# Patient Record
Sex: Female | Born: 1970
Health system: Southern US, Community
[De-identification: ages and names within clinical notes are randomized; demographics above are authoritative.]

## PROBLEM LIST (undated history)

## (undated) DIAGNOSIS — F32A Depression, unspecified: Secondary | ICD-10-CM

## (undated) DIAGNOSIS — G47419 Narcolepsy without cataplexy: Secondary | ICD-10-CM

## (undated) DIAGNOSIS — T7840XA Allergy, unspecified, initial encounter: Secondary | ICD-10-CM

## (undated) DIAGNOSIS — F419 Anxiety disorder, unspecified: Secondary | ICD-10-CM

## (undated) DIAGNOSIS — F329 Major depressive disorder, single episode, unspecified: Secondary | ICD-10-CM

## (undated) DIAGNOSIS — M1990 Unspecified osteoarthritis, unspecified site: Secondary | ICD-10-CM

## (undated) HISTORY — DX: Unspecified osteoarthritis, unspecified site: M19.90

## (undated) HISTORY — DX: Depression, unspecified: F32.A

## (undated) HISTORY — DX: Anxiety disorder, unspecified: F41.9

## (undated) HISTORY — DX: Allergy, unspecified, initial encounter: T78.40XA

## (undated) HISTORY — PX: TUBAL LIGATION: SHX77

## (undated) HISTORY — DX: Major depressive disorder, single episode, unspecified: F32.9

## (undated) HISTORY — DX: Narcolepsy without cataplexy: G47.419

---

## 1998-02-26 ENCOUNTER — Inpatient Hospital Stay (HOSPITAL_COMMUNITY): Admission: AD | Admit: 1998-02-26 | Discharge: 1998-02-26 | Payer: Self-pay | Admitting: Obstetrics & Gynecology

## 1998-04-13 ENCOUNTER — Encounter: Payer: Self-pay | Admitting: *Deleted

## 1998-04-13 ENCOUNTER — Encounter (HOSPITAL_COMMUNITY): Admission: RE | Admit: 1998-04-13 | Discharge: 1998-04-19 | Payer: Self-pay | Admitting: *Deleted

## 1998-04-14 ENCOUNTER — Encounter: Payer: Self-pay | Admitting: *Deleted

## 1998-04-16 ENCOUNTER — Encounter: Payer: Self-pay | Admitting: Obstetrics & Gynecology

## 1998-04-17 ENCOUNTER — Inpatient Hospital Stay (HOSPITAL_COMMUNITY): Admission: AD | Admit: 1998-04-17 | Discharge: 1998-04-19 | Payer: Self-pay | Admitting: Obstetrics

## 1998-07-10 ENCOUNTER — Emergency Department (HOSPITAL_COMMUNITY): Admission: EM | Admit: 1998-07-10 | Discharge: 1998-07-10 | Payer: Self-pay | Admitting: Emergency Medicine

## 2000-08-21 ENCOUNTER — Emergency Department (HOSPITAL_COMMUNITY): Admission: EM | Admit: 2000-08-21 | Discharge: 2000-08-21 | Payer: Self-pay

## 2001-03-09 ENCOUNTER — Encounter: Payer: Self-pay | Admitting: Emergency Medicine

## 2001-03-09 ENCOUNTER — Emergency Department (HOSPITAL_COMMUNITY): Admission: EM | Admit: 2001-03-09 | Discharge: 2001-03-09 | Payer: Self-pay | Admitting: Emergency Medicine

## 2001-03-11 ENCOUNTER — Emergency Department (HOSPITAL_COMMUNITY): Admission: EM | Admit: 2001-03-11 | Discharge: 2001-03-11 | Payer: Self-pay | Admitting: Emergency Medicine

## 2001-04-21 ENCOUNTER — Emergency Department (HOSPITAL_COMMUNITY): Admission: EM | Admit: 2001-04-21 | Discharge: 2001-04-21 | Payer: Self-pay | Admitting: Emergency Medicine

## 2001-04-29 ENCOUNTER — Encounter: Admission: RE | Admit: 2001-04-29 | Discharge: 2001-04-29 | Payer: Self-pay | Admitting: Internal Medicine

## 2001-06-30 ENCOUNTER — Emergency Department (HOSPITAL_COMMUNITY): Admission: EM | Admit: 2001-06-30 | Discharge: 2001-06-30 | Payer: Self-pay | Admitting: Emergency Medicine

## 2001-10-01 ENCOUNTER — Encounter: Payer: Self-pay | Admitting: Emergency Medicine

## 2001-10-01 ENCOUNTER — Emergency Department (HOSPITAL_COMMUNITY): Admission: EM | Admit: 2001-10-01 | Discharge: 2001-10-01 | Payer: Self-pay | Admitting: Emergency Medicine

## 2001-10-04 ENCOUNTER — Emergency Department (HOSPITAL_COMMUNITY): Admission: EM | Admit: 2001-10-04 | Discharge: 2001-10-04 | Payer: Self-pay | Admitting: Emergency Medicine

## 2001-10-04 ENCOUNTER — Encounter: Payer: Self-pay | Admitting: Emergency Medicine

## 2001-12-09 ENCOUNTER — Emergency Department (HOSPITAL_COMMUNITY): Admission: EM | Admit: 2001-12-09 | Discharge: 2001-12-09 | Payer: Self-pay | Admitting: Emergency Medicine

## 2001-12-25 ENCOUNTER — Encounter: Admission: RE | Admit: 2001-12-25 | Discharge: 2002-03-18 | Payer: Self-pay | Admitting: *Deleted

## 2002-03-17 ENCOUNTER — Encounter: Admission: RE | Admit: 2002-03-17 | Discharge: 2002-03-17 | Payer: Self-pay | Admitting: Internal Medicine

## 2004-09-02 ENCOUNTER — Emergency Department (HOSPITAL_COMMUNITY): Admission: EM | Admit: 2004-09-02 | Discharge: 2004-09-02 | Payer: Self-pay | Admitting: Family Medicine

## 2004-09-08 ENCOUNTER — Emergency Department (HOSPITAL_COMMUNITY): Admission: EM | Admit: 2004-09-08 | Discharge: 2004-09-08 | Payer: Self-pay | Admitting: Emergency Medicine

## 2005-01-30 ENCOUNTER — Emergency Department (HOSPITAL_COMMUNITY): Admission: EM | Admit: 2005-01-30 | Discharge: 2005-01-30 | Payer: Self-pay | Admitting: Emergency Medicine

## 2005-05-04 ENCOUNTER — Emergency Department (HOSPITAL_COMMUNITY): Admission: EM | Admit: 2005-05-04 | Discharge: 2005-05-04 | Payer: Self-pay | Admitting: Emergency Medicine

## 2005-11-30 IMAGING — CR DG ABDOMEN 1V
1 series · 1 of 1 positions shown · non-contrast
Comparison: none

CLINICAL DATA: Right-sided abdominal pain for one month, increasing pain over the last two days. Last bowel movement 2 days ago. 
 ABDOMEN - BHTCS: 
 An Ap view of the abdomen without previous films being available at this time for comparison show a moderate amount of fecal material within the right transverse colon. Also considerable fecal material in the region of the cecum.  No definite rectal fecal impaction is seen.  There appears to be some gas in the region of the sigmoid and considerable gas is present in the splenic flexure of the colon.  No small bowel dilatation is identified. The bones of the lower lumbar spine and pelvis appear to be within normal limits.

[view not recorded]
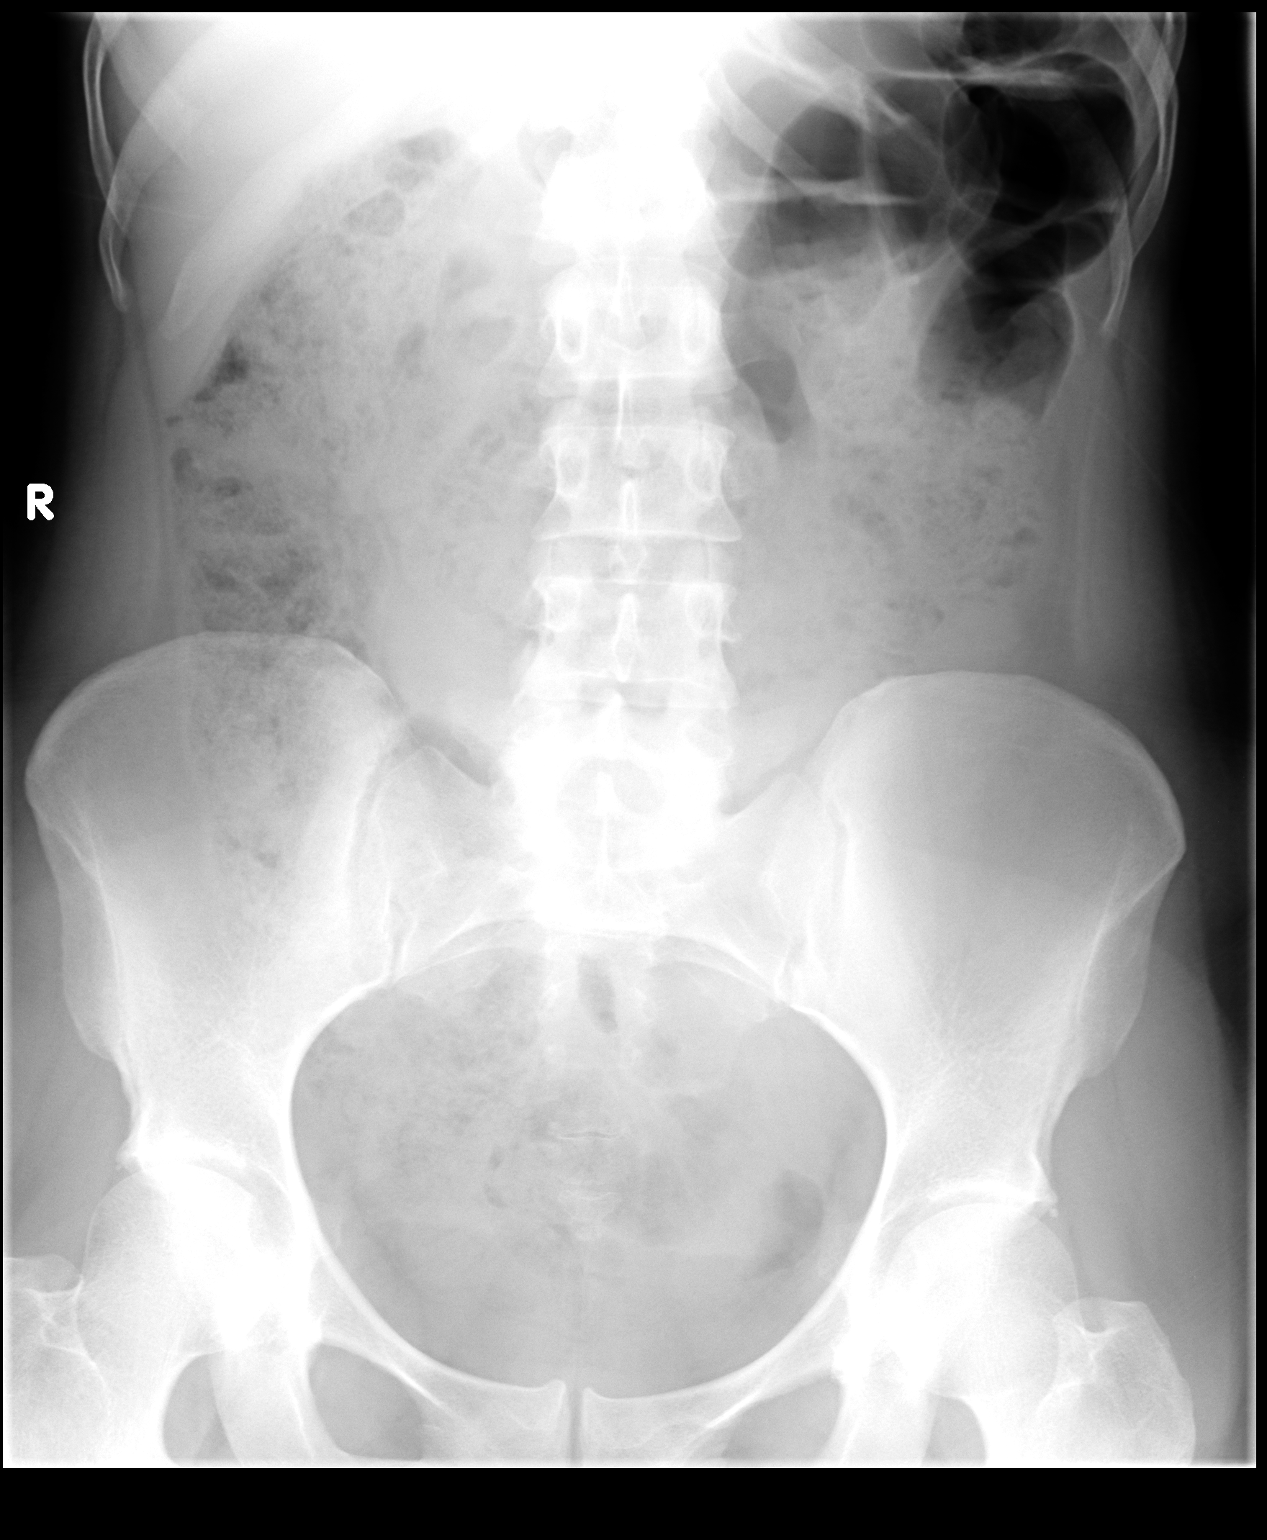

[1 of 1 positions shown; findings below may reference images not displayed]

IMPRESSION: Moderate amounts of fecal material, right and transverse colon and cecum.  Ileus region splenic flexure of the colon.  No fecal impaction in the rectum.
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
## 2013-09-30 ENCOUNTER — Emergency Department (HOSPITAL_COMMUNITY)
Admission: EM | Admit: 2013-09-30 | Discharge: 2013-09-30 | Disposition: A | Discharge status: Home / Self Care | Payer: Medicaid Other | Source: Home / Self Care | Attending: Family Medicine | Admitting: Family Medicine

## 2013-09-30 ENCOUNTER — Encounter (HOSPITAL_COMMUNITY): Payer: Self-pay | Admitting: Emergency Medicine

## 2013-09-30 DIAGNOSIS — J0110 Acute frontal sinusitis, unspecified: Secondary | ICD-10-CM | POA: Diagnosis not present

## 2013-09-30 DIAGNOSIS — H00013 Hordeolum externum right eye, unspecified eyelid: Secondary | ICD-10-CM

## 2013-09-30 DIAGNOSIS — H00019 Hordeolum externum unspecified eye, unspecified eyelid: Secondary | ICD-10-CM | POA: Diagnosis not present

## 2013-09-30 LAB — POCT RAPID STREP A: STREPTOCOCCUS, GROUP A SCREEN (DIRECT): NEGATIVE

## 2013-09-30 MED ORDER — IPRATROPIUM BROMIDE 0.06 % NA SOLN: 2.0000 | Freq: Four times a day (QID) | NASAL | Status: DC

## 2013-09-30 MED ORDER — IBUPROFEN 600 MG PO TABS: 600.0000 mg | ORAL_TABLET | Freq: Four times a day (QID) | ORAL | Status: DC | PRN

## 2013-09-30 MED ORDER — AMOXICILLIN-POT CLAVULANATE 875-125 MG PO TABS: 1.0000 | ORAL_TABLET | Freq: Two times a day (BID) | ORAL | Status: DC

## 2013-09-30 NOTE — ED Provider Notes (Signed)
CSN: [id number]     Arrival date & time 09/30/13  1100 History   First MD Initiated Contact with Patient 09/30/13 1141     Chief Complaint  Patient presents with  . URI   (Consider location/radiation/quality/duration/timing/severity/associated sxs/prior Treatment) HPI  Started w/ cough 7 days ago. gettign worse. Now /w subjective fevers, chills, earraches. Emesis x2 today. Sinus pressure and HA. Denies CP, SOB, palpitations.   R eyelid bump: started yesterday. Non-painful. Allergy eyedrops w/ mild reduction in size.   History reviewed. No pertinent past medical history. Past Surgical History  Procedure Laterality Date  . Tubal ligation     No family history on file. History  Substance Use Topics  . Smoking status: Current Every Day Smoker -- 0.50 packs/day  . Smokeless tobacco: Not on file  . Alcohol Use: Yes   OB History   Grav Para Term Preterm Abortions TAB SAB Ect Mult Living                 Review of Systems Per HPI with all other pertinent systems negative.   Allergies  Aspirin  Home Medications   Prior to Admission medications   Medication Sig Start Date End Date Taking? Authorizing Provider  Pseudoeph-Doxylamine-DM-APAP (NYQUIL PO) Take by mouth.   Yes Historical Provider, MD  pseudoephedrine-acetaminophen (TYLENOL SINUS) 30-500 MG TABS Take 1 tablet by mouth every 4 (four) hours as needed.   Yes Historical Provider, MD  amoxicillin-clavulanate (AUGMENTIN) 875-125 MG per tablet Take 1 tablet by mouth 2 (two) times daily. 09/30/13   Ozella Rocks, MD  ibuprofen (ADVIL,MOTRIN) 600 MG tablet Take 1 tablet (600 mg total) by mouth every 6 (six) hours as needed. 09/30/13   Ozella Rocks, MD  ipratropium (ATROVENT) 0.06 % nasal spray Place 2 sprays into both nostrils 4 (four) times daily. 09/30/13   Ozella Rocks, MD   BP 106/66  Pulse 84  Temp(Src) 98.3 F (36.8 C) (Oral)  Resp 16  SpO2 100%  LMP 09/09/2013 Physical Exam  Constitutional: She is oriented to  person, place, and time. She appears well-developed and well-nourished. No distress.  HENT:  Head: Normocephalic.  Frontal sinus ttp bilat. Pharyngeal injection Nasal speech Minimal cervical lymphadenopathy TM nml bilat  Eyes: EOM are normal. Pupils are equal, round, and reactive to light.  Cardiovascular: Normal rate, normal heart sounds and intact distal pulses.   No murmur heard. Pulmonary/Chest: Effort normal and breath sounds normal. No respiratory distress. She has no wheezes. She has no rales. She exhibits no tenderness.  Abdominal: Soft.  Musculoskeletal: Normal range of motion. She exhibits no edema and no tenderness.  Neurological: She is alert and oriented to person, place, and time. No cranial nerve deficit. Coordination normal.  Skin: No rash noted. She is not diaphoretic.  Psychiatric: She has a normal mood and affect. Her behavior is normal. Thought content normal.    ED Course  Procedures (including critical care time) Labs Review Labs Reviewed  POCT RAPID STREP A ([facility] CARE ONLY)    Imaging Review No results found.   MDM   1. Subacute frontal sinusitis   2. Stye, right    Sinusitis adn ongoing/worsening infection for 7 days - start augmentin - motrin 600 (ASA allergy reviewed and pt has taken motrin in past w/o difficulty) - atrovent for intermittent rhinorrhea  - nasal saline - mucinex  Stye: warm compresses  Precautions given and all questions answered  Shelly Flatten, MD Family Medicine 09/30/2013, 12:07 PM  Ozella Rocksavid J Merrell, MD 09/30/13 [phone]

## 2013-09-30 NOTE — ED Notes (Signed)
Uri: cough, green, yellow phlegm.  Right eye swollen and patient reports drainage from right eye particular in am.  Has a sore throat (intermittent), general aches , and ears hurt.  Onset one week ago. The upper respiratory symptoms are patients primary reason for being at urgent care today

## 2013-09-30 NOTE — Discharge Instructions (Signed)
You have a sinus infection that needs antibiotics Please take them until they are gone Please use the atrovent to dry up your nasal secretions Please use the motrin for fevers, swelling and pain relief Please try mucinex to help with your cough Please come back if you do not get better.  Please apply warm compresses to your R eye 2-4 times a day until the stye resolves  Sty A sty (hordeolum) is an infection of a gland in the eyelid located at the base of the eyelash. A sty may develop a white or yellow head of pus. It can be puffy (swollen). Usually, the sty will burst and pus will come out on its own. They do not leave lumps in the eyelid once they drain. A sty is often confused with another form of cyst of the eyelid called a chalazion. Chalazions occur within the eyelid and not on the edge where the bases of the eyelashes are. They often are red, sore and then form firm lumps in the eyelid. CAUSES   Germs (bacteria).  Lasting (chronic) eyelid inflammation. SYMPTOMS   Tenderness, redness and swelling along the edge of the eyelid at the base of the eyelashes.  Sometimes, there is a white or yellow head of pus. It may or may not drain. DIAGNOSIS  An ophthalmologist will be able to distinguish between a sty and a chalazion and treat the condition appropriately.  TREATMENT   Styes are typically treated with warm packs (compresses) until drainage occurs.  In rare cases, medicines that kill germs (antibiotics) may be prescribed. These antibiotics may be in the form of drops, cream or pills.  If a hard lump has formed, it is generally necessary to do a small incision and remove the hardened contents of the cyst in a minor surgical procedure done in the office.  In suspicious cases, your caregiver may send the contents of the cyst to the lab to be certain that it is not a rare, but dangerous form of cancer of the glands of the eyelid. HOME CARE INSTRUCTIONS   Wash your hands often and dry  them with a clean towel. Avoid touching your eyelid. This may spread the infection to other parts of the eye.  Apply heat to your eyelid for 10 to 20 minutes, several times a day, to ease pain and help to heal it faster.  Do not squeeze the sty. Allow it to drain on its own. Wash your eyelid carefully 3 to 4 times per day to remove any pus. SEEK IMMEDIATE MEDICAL CARE IF:   Your eye becomes painful or puffy (swollen).  Your vision changes.  Your sty does not drain by itself within 3 days.  Your sty comes back within a short period of time, even with treatment.  You have redness (inflammation) around the eye.  You have a fever. Document Released: 11/09/2004 Document Revised: 04/24/2011 Document Reviewed: 05/16/2013 [facility]ExitCare Patient Information 2015 [place]. This information is not intended to replace advice given to you by your health care provider. Make sure you discuss any questions you have with your health care provider.   Sinusitis Sinusitis is redness, soreness, and puffiness (inflammation) of the air pockets in the bones of your face (sinuses). The redness, soreness, and puffiness can cause air and mucus to get trapped in your sinuses. This can allow germs to grow and cause an infection.  HOME CARE   Drink enough fluids to keep your pee (urine) clear or pale yellow.  Use a  humidifier in your home.  Run a hot shower to create steam in the bathroom. Sit in the bathroom with the door closed. Breathe in the steam 3-4 times a day.  Put a warm, moist washcloth on your face 3-4 times a day, or as told by your doctor.  Use salt water sprays (saline sprays) to wet the thick fluid in your nose. This can help the sinuses drain.  Only take medicine as told by your doctor. GET HELP RIGHT AWAY IF:   Your pain gets worse.  You have very bad headaches.  You are sick to your stomach (nauseous).  You throw up (vomit).  You are very sleepy (drowsy) all the time.  Your face  is puffy (swollen).  Your vision changes.  You have a stiff neck.  You have trouble breathing. MAKE SURE YOU:   Understand these instructions.  Will watch your condition.  Will get help right away if you are not doing well or get worse. Document Released: 07/19/2007 Document Revised: 10/25/2011 Document Reviewed: 09/05/2011 [employer] Patient Information 2015 [place], [place]. This information is not intended to replace advice given to you by your health care provider. Make sure you discuss any questions you have with your health care provider.

## 2013-10-02 LAB — CULTURE, GROUP A STREP

## 2014-01-22 ENCOUNTER — Emergency Department (HOSPITAL_COMMUNITY): Payer: Medicaid Other

## 2014-01-22 ENCOUNTER — Encounter (HOSPITAL_COMMUNITY): Payer: Self-pay | Admitting: Emergency Medicine

## 2014-01-22 ENCOUNTER — Emergency Department (HOSPITAL_COMMUNITY)
Admission: EM | Admit: 2014-01-22 | Discharge: 2014-01-22 | Disposition: A | Discharge status: Home / Self Care | Payer: Medicaid Other | Attending: Emergency Medicine | Admitting: Emergency Medicine

## 2014-01-22 DIAGNOSIS — R109 Unspecified abdominal pain: Secondary | ICD-10-CM | POA: Diagnosis not present

## 2014-01-22 DIAGNOSIS — R110 Nausea: Secondary | ICD-10-CM | POA: Insufficient documentation

## 2014-01-22 DIAGNOSIS — N12 Tubulo-interstitial nephritis, not specified as acute or chronic: Secondary | ICD-10-CM | POA: Diagnosis not present

## 2014-01-22 DIAGNOSIS — Z9851 Tubal ligation status: Secondary | ICD-10-CM | POA: Insufficient documentation

## 2014-01-22 DIAGNOSIS — Z3202 Encounter for pregnancy test, result negative: Secondary | ICD-10-CM | POA: Insufficient documentation

## 2014-01-22 DIAGNOSIS — Z720 Tobacco use: Secondary | ICD-10-CM | POA: Diagnosis not present

## 2014-01-22 LAB — CBC WITH DIFFERENTIAL/PLATELET
BASOS PCT: 0 % (ref 0–1)
Basophils Absolute: 0 10*3/uL (ref 0.0–0.1)
Eosinophils Absolute: 0.2 10*3/uL (ref 0.0–0.7)
Eosinophils Relative: 2 % (ref 0–5)
HEMATOCRIT: 37.9 % (ref 36.0–46.0)
Hemoglobin: 12.5 g/dL (ref 12.0–15.0)
Lymphocytes Relative: 30 % (ref 12–46)
Lymphs Abs: 3.1 10*3/uL (ref 0.7–4.0)
MCH: 25.2 pg — AB (ref 26.0–34.0)
MCHC: 33 g/dL (ref 30.0–36.0)
MCV: 76.3 fL — ABNORMAL LOW (ref 78.0–100.0)
MONOS PCT: 8 % (ref 3–12)
Monocytes Absolute: 0.8 10*3/uL (ref 0.1–1.0)
NEUTROS PCT: 60 % (ref 43–77)
Neutro Abs: 6.2 10*3/uL (ref 1.7–7.7)
Platelets: 172 10*3/uL (ref 150–400)
RBC: 4.97 MIL/uL (ref 3.87–5.11)
RDW: 14.1 % (ref 11.5–15.5)
WBC: 10.3 10*3/uL (ref 4.0–10.5)

## 2014-01-22 LAB — URINALYSIS, ROUTINE W REFLEX MICROSCOPIC
BILIRUBIN URINE: NEGATIVE
Glucose, UA: NEGATIVE mg/dL
KETONES UR: NEGATIVE mg/dL
Nitrite: POSITIVE — AB
PH: 6 (ref 5.0–8.0)
Protein, ur: 30 mg/dL — AB
Specific Gravity, Urine: 1.016 (ref 1.005–1.030)
UROBILINOGEN UA: 0.2 mg/dL (ref 0.0–1.0)

## 2014-01-22 LAB — COMPREHENSIVE METABOLIC PANEL
ALBUMIN: 4.2 g/dL (ref 3.5–5.2)
ALK PHOS: 100 U/L (ref 39–117)
ALT: 26 U/L (ref 0–35)
ANION GAP: 13 (ref 5–15)
AST: 19 U/L (ref 0–37)
BUN: 8 mg/dL (ref 6–23)
CO2: 25 meq/L (ref 19–32)
CREATININE: 0.74 mg/dL (ref 0.50–1.10)
Calcium: 9.9 mg/dL (ref 8.4–10.5)
Chloride: 100 mEq/L (ref 96–112)
GFR calc Af Amer: >90 mL/min (ref >90)
GLUCOSE: 118 mg/dL — AB (ref 70–99)
Potassium: 4 mEq/L (ref 3.7–5.3)
Sodium: 138 mEq/L (ref 137–147)
TOTAL PROTEIN: 7.7 g/dL (ref 6.0–8.3)
Total Bilirubin: 0.6 mg/dL (ref 0.3–1.2)

## 2014-01-22 LAB — PREGNANCY, URINE: PREG TEST UR: NEGATIVE

## 2014-01-22 LAB — URINE MICROSCOPIC-ADD ON

## 2014-01-22 MED ORDER — DEXTROSE 5 % IV SOLN
1.0000 g | Freq: Once | INTRAVENOUS | Status: AC
  Administered 2014-01-22: 1 g via INTRAVENOUS
  Filled 2014-01-22: qty 10

## 2014-01-22 MED ORDER — CIPROFLOXACIN HCL 500 MG PO TABS: 500.0000 mg | ORAL_TABLET | Freq: Two times a day (BID) | ORAL | Status: DC

## 2014-01-22 MED ORDER — HYDROCODONE-ACETAMINOPHEN 5-325 MG PO TABS: 1.0000 | ORAL_TABLET | Freq: Four times a day (QID) | ORAL | Status: DC | PRN

## 2014-01-22 MED ORDER — HYDROMORPHONE HCL 1 MG/ML IJ SOLN
1.0000 mg | Freq: Once | INTRAMUSCULAR | Status: AC
  Administered 2014-01-22: 1 mg via INTRAVENOUS
  Filled 2014-01-22: qty 1

## 2014-01-22 MED ORDER — ONDANSETRON HCL 4 MG/2ML IJ SOLN
4.0000 mg | Freq: Once | INTRAMUSCULAR | Status: AC
  Administered 2014-01-22: 4 mg via INTRAVENOUS
  Filled 2014-01-22: qty 2

## 2014-01-22 NOTE — ED Provider Notes (Addendum)
CSN: [id number]     Arrival date & time 01/22/14  2018 History   First MD Initiated Contact with Patient 01/22/14 2057     Chief Complaint  Patient presents with  . Abdominal Pain     (Consider location/radiation/quality/duration/timing/severity/associated sxs/prior Treatment) Patient is a 43 y.o. female presenting with flank pain. The history is provided by the patient.  Flank Pain This is a new problem. The current episode started 12 to 24 hours ago. The problem occurs constantly. The problem has been gradually worsening. Associated symptoms include abdominal pain. Pertinent negatives include no chest pain and no shortness of breath. Associated symptoms comments: No fever.  Nausea without vomiting.  No cough, congestion.  No vaginal discharge or bleeding and some dysuria. Exacerbated by: urinating. Nothing relieves the symptoms. She has tried nothing for the symptoms. The treatment provided no relief.    History reviewed. No pertinent past medical history. Past Surgical History  Procedure Laterality Date  . Tubal ligation     No family history on file. History  Substance Use Topics  . Smoking status: Current Every Day Smoker -- 0.50 packs/day  . Smokeless tobacco: Not on file  . Alcohol Use: Yes   OB History    No data available     Review of Systems  Constitutional: Negative for fever.  Respiratory: Negative for shortness of breath.   Cardiovascular: Negative for chest pain.  Gastrointestinal: Positive for abdominal pain.  Genitourinary: Positive for flank pain.  All other systems reviewed and are negative.     Allergies  Aspirin  Home Medications   Prior to Admission medications   Medication Sig Start Date End Date Taking? Authorizing Provider  amoxicillin-clavulanate (AUGMENTIN) 875-125 MG per tablet Take 1 tablet by mouth 2 (two) times daily. Patient not taking: Reported on 01/22/2014 09/30/13   Ozella Rocksavid J Merrell, MD  ibuprofen (ADVIL,MOTRIN) 600 MG tablet Take  1 tablet (600 mg total) by mouth every 6 (six) hours as needed. Patient not taking: Reported on 01/22/2014 09/30/13   Ozella Rocksavid J Merrell, MD  ipratropium (ATROVENT) 0.06 % nasal spray Place 2 sprays into both nostrils 4 (four) times daily. Patient not taking: Reported on 01/22/2014 09/30/13   Ozella Rocksavid J Merrell, MD   BP 121/73 mmHg  Pulse 98  Temp(Src) 98.6 F (37 C) (Oral)  Resp 14  Ht 5' 4.5" (1.638 m)  Wt 158 lb (71.668 kg)  BMI 26.71 kg/m2  SpO2 99%  LMP 01/10/2014 Physical Exam  Constitutional: She is oriented to person, place, and time. She appears well-developed and well-nourished. No distress.  Appears uncomfortable  HENT:  Head: Normocephalic and atraumatic.  Mouth/Throat: Oropharynx is clear and moist.  Eyes: Conjunctivae and EOM are normal. Pupils are equal, round, and reactive to light.  Neck: Normal range of motion. Neck supple.  Cardiovascular: Normal rate, regular rhythm and intact distal pulses.   No murmur heard. Pulmonary/Chest: Effort normal and breath sounds normal. No respiratory distress. She has no wheezes. She has no rales.  Abdominal: Soft. Normal appearance. She exhibits no distension. There is tenderness in the suprapubic area. There is CVA tenderness. There is no rebound and no guarding.  Right flank tenderness  Musculoskeletal: Normal range of motion. She exhibits no edema or tenderness.  Neurological: She is alert and oriented to person, place, and time.  Skin: Skin is warm and dry. No rash noted. No erythema.  Psychiatric: She has a normal mood and affect. Her behavior is normal.  Nursing note and vitals reviewed.  ED Course  Procedures (including critical care time) Labs Review Labs Reviewed  CBC WITH DIFFERENTIAL - Abnormal; Notable for the following:    MCV 76.3 (*)    MCH 25.2 (*)    All other components within normal limits  COMPREHENSIVE METABOLIC PANEL - Abnormal; Notable for the following:    Glucose, Bld 118 (*)    All other components  within normal limits  URINALYSIS, ROUTINE W REFLEX MICROSCOPIC - Abnormal; Notable for the following:    APPearance CLOUDY (*)    Hgb urine dipstick LARGE (*)    Protein, ur 30 (*)    Nitrite POSITIVE (*)    Leukocytes, UA LARGE (*)    All other components within normal limits  URINE MICROSCOPIC-ADD ON - Abnormal; Notable for the following:    Bacteria, UA MANY (*)    All other components within normal limits  PREGNANCY, URINE    Imaging Review Ct Abdomen Pelvis Wo Contrast  01/22/2014   CLINICAL DATA:  Right flank pain and  urinary frequency today.  EXAM: CT ABDOMEN AND PELVIS WITHOUT CONTRAST  TECHNIQUE: Multidetector CT imaging of the abdomen and pelvis was performed following the standard protocol without IV contrast.  COMPARISON:  None.  FINDINGS: There is no nephrolithiasis bilaterally. There is slight malrotation of the right kidney. There is slight fullness of right renal pelvis. There is no evidence of hydroureter bilaterally.  The liver, spleen, pancreas, gallbladder, adrenal glands are normal there is minimal atherosclerosis of the abdominal aorta without aneurysmal dilatation. There is no abdominal lymphadenopathy. There is no small bowel obstruction or diverticulitis. The appendix is normal.  Partial decompressed bladder limits evaluation. The uterus is normal. 4.3 mm calcific densities identified within the posterior right pelvis on image 58 more favor venous phlebolith or vascular calcification. Mild dependent atelectasis of the right lung base is noted. Visualized bones demonstrate no acute abnormality.  IMPRESSION: Slight mal rotation of the right kidney with mild fullness of the right renal pelvis. No hydroureter is identified bilaterally. No nephrolithiasis bilaterally.   Electronically Signed   By: Sherian ReinWei-Chen  Lin M.D.   On: 01/22/2014 22:03     EKG Interpretation None      MDM   Final diagnoses:  Right flank pain  Pyelonephritis   Pt here with Right flank pain  radiating to groin that started earlier today and got worse.  No vaginal sx and no resp and cardiac sx.  Concern for kidney stone vs pyelonephritis.    10:17 PM Labs consistent with UTI.  CT neg for stone.  Will treat with pain and antibiotics.   10:48 PM On re-eval pt's pain is gone.  Will treat with cipro and d/c home.  Gwyneth SproutWhitney Kristyl Athens, MD 01/22/14 [id number]  Gwyneth SproutWhitney Bharath Bernstein, MD 01/22/14 2252

## 2014-01-22 NOTE — ED Notes (Addendum)
Dr. Plunkett MD at bedside. 

## 2014-01-22 NOTE — ED Notes (Signed)
Pt. reports right lateral abdominal pain radiating to right flank onset today , denies dysuria , no hematuria , denies nausea or vomitting . No fever or chills.

## 2016-03-06 ENCOUNTER — Emergency Department (HOSPITAL_COMMUNITY)
Admission: EM | Admit: 2016-03-06 | Discharge: 2016-03-07 | Disposition: A | Discharge status: Home / Self Care | Payer: Medicare Other | Attending: Emergency Medicine | Admitting: Emergency Medicine

## 2016-03-06 ENCOUNTER — Encounter (HOSPITAL_COMMUNITY): Payer: Self-pay | Admitting: Emergency Medicine

## 2016-03-06 DIAGNOSIS — K0889 Other specified disorders of teeth and supporting structures: Secondary | ICD-10-CM

## 2016-03-06 DIAGNOSIS — K047 Periapical abscess without sinus: Secondary | ICD-10-CM

## 2016-03-06 DIAGNOSIS — F17200 Nicotine dependence, unspecified, uncomplicated: Secondary | ICD-10-CM | POA: Insufficient documentation

## 2016-03-06 MED ORDER — ACETAMINOPHEN 325 MG PO TABS
650.0000 mg | ORAL_TABLET | Freq: Once | ORAL | Status: AC
  Administered 2016-03-07: 650 mg via ORAL
  Filled 2016-03-06: qty 2

## 2016-03-06 MED ORDER — PENICILLIN V POTASSIUM 500 MG PO TABS: 500.0000 mg | ORAL_TABLET | Freq: Four times a day (QID) | ORAL | 0 refills | Status: DC

## 2016-03-06 MED ORDER — TRAMADOL HCL 50 MG PO TABS
50.0000 mg | ORAL_TABLET | Freq: Once | ORAL | Status: AC
  Administered 2016-03-07: 50 mg via ORAL
  Filled 2016-03-06: qty 1

## 2016-03-06 MED ORDER — NAPROXEN 500 MG PO TABS
500.0000 mg | ORAL_TABLET | Freq: Two times a day (BID) | ORAL | 0 refills | Status: DC
Start: 2016-03-06 — End: 2017-04-12

## 2016-03-06 MED ORDER — TRAMADOL HCL 50 MG PO TABS: 50.0000 mg | ORAL_TABLET | Freq: Four times a day (QID) | ORAL | 0 refills | Status: DC | PRN

## 2016-03-06 NOTE — ED Triage Notes (Signed)
Pt st's she had pain in upper front tooth yesterday and today she has abscess in top of mouth.  St's tooth pain has subsided.

## 2016-03-06 NOTE — Discharge Instructions (Addendum)
Please read and follow all provided instructions.  Your diagnoses today include:  1. Dental abscess   2. Pain, dental    Tests performed today include: Vital signs. See below for your results today.   Medications prescribed:  Take as prescribed   Home care instructions:  Follow any educational materials contained in this packet.  Follow-up instructions: Please follow-up with your dentist for further evaluation of symptoms and treatment   Please Contact this number: [phone] to get into contact with an emergency dental service to schedule an appointment with a local dentist   Return instructions:  Please return to the Emergency Department if you do not get better, if you get worse, or new symptoms OR  - Fever (temperature greater than 101.18F)  - Bleeding that does not stop with holding pressure to the area    -Severe pain (please note that you may be more sore the day after your accident)  - Chest Pain  - Difficulty breathing  - Severe nausea or vomiting  - Inability to tolerate food and liquids  - Passing out  - Skin becoming red around your wounds  - Change in mental status (confusion or lethargy)  - New numbness or weakness    Please return if you have any other emergent concerns.  Additional Information:  Your vital signs today were: BP 131/86 (BP Location: Right Arm)    Pulse 87    Temp 98.9 F (37.2 C) (Oral)    Resp 18    Ht 5' 5.5" (1.664 m)    Wt 65.8 kg    LMP 02/28/2016 (Approximate)    SpO2 99%    BMI 23.76 kg/m  If your blood pressure (BP) was elevated above 135/85 this visit, please have this repeated by your doctor within one month. ---------------

## 2016-03-06 NOTE — ED Notes (Signed)
PA at bedside.

## 2016-03-06 NOTE — ED Provider Notes (Signed)
[facility]-EMERGENCY DEPT Provider Note   CSN: [id number] Arrival date & time: 03/06/16  2140   By signing my name below, I, Sherry Leonard, attest that this documentation has been prepared under the direction and in the presence of Audry Piliyler Braden Cimo, PA-C Electronically Signed: Soijett Leonard, ED Scribe. 03/06/16. 10:35 PM.  History   Chief Complaint Chief Complaint  Patient presents with  . Abscess    HPI Sherry Leonard is a 46 y.o. female who presents to the Emergency Department complaining of abscess to top of mouth onset earlier today. Pt states that she called a dentist to set up an appointment with no success. Pt notes that her symptoms began with upper front dental pain x yesterday that has since resolved. She has tried warm saltwater gargles without medications with no relief of her symptoms. Denies trouble swallowing, appetite change, chills, drainage, and any other symptoms.   The history is provided by the patient. No language interpreter was used.    History reviewed. No pertinent past medical history.  There are no active problems to display for this patient.   Past Surgical History:  Procedure Laterality Date  . TUBAL LIGATION      OB History    No data available       Home Medications    Prior to Admission medications   Medication Sig Start Date End Date Taking? Authorizing Provider  amoxicillin-clavulanate (AUGMENTIN) 875-125 MG per tablet Take 1 tablet by mouth 2 (two) times daily. Patient not taking: Reported on 01/22/2014 09/30/13   Ozella Rocksavid J Merrell, MD  ciprofloxacin (CIPRO) 500 MG tablet Take 1 tablet (500 mg total) by mouth 2 (two) times daily. 01/22/14   Gwyneth SproutWhitney Plunkett, MD  HYDROcodone-acetaminophen (NORCO/VICODIN) 5-325 MG per tablet Take 1-2 tablets by mouth every 6 (six) hours as needed for moderate pain or severe pain. 01/22/14   Gwyneth SproutWhitney Plunkett, MD  ibuprofen (ADVIL,MOTRIN) 600 MG tablet Take 1 tablet (600 mg total) by mouth every 6 (six) hours as  needed. Patient not taking: Reported on 01/22/2014 09/30/13   Ozella Rocksavid J Merrell, MD  ipratropium (ATROVENT) 0.06 % nasal spray Place 2 sprays into both nostrils 4 (four) times daily. Patient not taking: Reported on 01/22/2014 09/30/13   Ozella Rocksavid J Merrell, MD    Family History No family history on file.  Social History Social History  Substance Use Topics  . Smoking status: Current Every Day Smoker    Packs/day: 0.50  . Smokeless tobacco: Never Used  . Alcohol use Yes     Allergies   Aspirin   Review of Systems Review of Systems  Constitutional: Negative for appetite change, chills and fever.  HENT: Positive for dental problem (resolved). Negative for trouble swallowing.        +abscess to roof of mouth without drainage.   Physical Exam Updated Vital Signs BP 131/86 (BP Location: Right Arm)   Pulse 87   Temp 98.9 F (37.2 C) (Oral)   Resp 18   Ht 5' 5.5" (1.664 m)   Wt 145 lb (65.8 kg)   LMP 02/28/2016 (Approximate)   SpO2 99%   BMI 23.76 kg/m   Physical Exam  Constitutional: She is oriented to person, place, and time. She appears well-developed and well-nourished. No distress.  HENT:  Head: Normocephalic and atraumatic.  Mouth/Throat: Uvula is midline, oropharynx is clear and moist and mucous membranes are normal. No trismus in the jaw. Abnormal dentition. Dental caries present. No oropharyngeal exudate, posterior oropharyngeal erythema or tonsillar abscesses.  1 cm  fluctuant abscess on roof of mouth, posterior to tooth #7. No trismus. No uvular deviation. Poor dentition noted with dental caries  Eyes: EOM are normal.  Neck: Neck supple.  Cardiovascular: Normal rate.   Pulmonary/Chest: Effort normal. No respiratory distress.  Abdominal: She exhibits no distension.  Musculoskeletal: Normal range of motion.  Neurological: She is alert and oriented to person, place, and time.  Skin: Skin is warm and dry.  Psychiatric: She has a normal mood and affect. Her behavior is  normal.  Nursing note and vitals reviewed.  ED Treatments / Results  DIAGNOSTIC STUDIES: Oxygen Saturation is 99% on RA, nl by my interpretation.    COORDINATION OF CARE: 10:25 PM Discussed treatment plan with pt at bedside which includes I&D, abx Rx, referral and follow up with dentist and pt agreed to plan.   Procedures Dental Block Date/Time: 03/06/2016 10:34 PM Performed by: Audry Pili Authorized by: Audry Pili   Consent:    Consent obtained:  Verbal   Consent given by:  Patient   Risks discussed:  Infection Indications:    Indications: dental abscess   Location:    Anesthesia block type: superior soft palate. Procedure details (see MAR for exact dosages):    Syringe type:  Aspirating dental syringe   Needle gauge:  27 G   Anesthetic injected:  Bupivacaine 0.5% WITH epi   Injection procedure:  Anatomic landmarks identified, anatomic landmarks palpated and introduced needle Post-procedure details:    Outcome: minimal anesthesia achieved due to pt moving.   Patient tolerance of procedure:  Tolerated well, no immediate complications   (including critical care time)  Medications Ordered in ED Medications  traMADol (ULTRAM) tablet 50 mg (not administered)  acetaminophen (TYLENOL) tablet 650 mg (not administered)     Initial Impression / Assessment and Plan / ED Course  I have reviewed the triage vital signs and the nursing notes.  I have reviewed the relevant previous healthcare records. I obtained HPI from historian.  ED Course:  Assessment: Dental pain associated with dental cary with signs of small dental abscess on superior soft palate with patient afebrile, non toxic appearing and swallowing secretions well. Exam unconcerning for Ludwig's angina or other deep tissue infection in neck. As there is swelling, but no erythema, and no facial swelling, will treat with antibiotic and pain medicine. Urged patient to follow-up with dentist. Attempted to drain abscess in  ED, but patient refused further intervention due to too much pain on attempt for analgesia. Counseled on home tx for dental abscess and strict return precautions.   I gave patient referral to dentist and stressed the importance of dental follow up for ultimate management of dental pain. Patient voices understanding and is agreeable to plan.   Disposition/Plan:  DC home Additional Verbal discharge instructions given and discussed with patient.  Pt Instructed to f/u with dentist in the next week for evaluation and treatment of symptoms. Return precautions given Pt acknowledges and agrees with plan  Supervising Physician Marily Memos, MD  Final Clinical Impressions(s) / ED Diagnoses   Final diagnoses:  Dental abscess  Pain, dental    New Prescriptions New Prescriptions   No medications on file    I personally performed the services described in this documentation, which was scribed in my presence. The recorded information has been reviewed and is accurate.    Audry Pili, PA-C 03/07/16 0021    Marily Memos, MD 03/07/16 1250

## 2016-03-07 DIAGNOSIS — K047 Periapical abscess without sinus: Secondary | ICD-10-CM | POA: Diagnosis not present

## 2016-03-07 MED ORDER — TRAMADOL HCL 50 MG PO TABS: 50.0000 mg | ORAL_TABLET | Freq: Four times a day (QID) | ORAL | 0 refills | Status: DC | PRN

## 2016-03-07 MED ORDER — CLINDAMYCIN HCL 300 MG PO CAPS: 300.0000 mg | ORAL_CAPSULE | Freq: Three times a day (TID) | ORAL | 0 refills | Status: AC

## 2016-03-07 MED ORDER — MORPHINE SULFATE (PF) 4 MG/ML IV SOLN
4.0000 mg | Freq: Once | INTRAVENOUS | Status: AC
  Administered 2016-03-07: 4 mg via INTRAMUSCULAR
  Filled 2016-03-07: qty 1

## 2016-03-07 NOTE — ED Notes (Signed)
Pt complaining of extreme pain. PA notified.

## 2017-04-12 ENCOUNTER — Encounter: Payer: Self-pay | Admitting: Family Medicine

## 2017-04-12 ENCOUNTER — Ambulatory Visit (INDEPENDENT_AMBULATORY_CARE_PROVIDER_SITE_OTHER): Payer: Medicare Other | Admitting: Family Medicine

## 2017-04-12 ENCOUNTER — Other Ambulatory Visit: Payer: Self-pay

## 2017-04-12 VITALS — BP 107/76 | HR 78 | Temp 99.1°F | Resp 17 | Ht 66.5 in | Wt 172.6 lb

## 2017-04-12 DIAGNOSIS — R5382 Chronic fatigue, unspecified: Secondary | ICD-10-CM

## 2017-04-12 DIAGNOSIS — R3589 Other polyuria: Secondary | ICD-10-CM

## 2017-04-12 DIAGNOSIS — Z833 Family history of diabetes mellitus: Secondary | ICD-10-CM | POA: Diagnosis not present

## 2017-04-12 DIAGNOSIS — F322 Major depressive disorder, single episode, severe without psychotic features: Secondary | ICD-10-CM

## 2017-04-12 DIAGNOSIS — R358 Other polyuria: Secondary | ICD-10-CM

## 2017-04-12 DIAGNOSIS — J310 Chronic rhinitis: Secondary | ICD-10-CM | POA: Diagnosis not present

## 2017-04-12 DIAGNOSIS — R7301 Impaired fasting glucose: Secondary | ICD-10-CM | POA: Diagnosis not present

## 2017-04-12 DIAGNOSIS — Z131 Encounter for screening for diabetes mellitus: Secondary | ICD-10-CM

## 2017-04-12 DIAGNOSIS — G47419 Narcolepsy without cataplexy: Secondary | ICD-10-CM

## 2017-04-12 DIAGNOSIS — F17200 Nicotine dependence, unspecified, uncomplicated: Secondary | ICD-10-CM | POA: Diagnosis not present

## 2017-04-12 MED ORDER — CETIRIZINE HCL 10 MG PO TABS: 10.0000 mg | ORAL_TABLET | Freq: Every day | ORAL | 11 refills | Status: DC

## 2017-04-12 MED ORDER — FLUTICASONE PROPIONATE 50 MCG/ACT NA SUSP: 2.0000 | Freq: Every day | NASAL | 6 refills | Status: DC

## 2017-04-12 NOTE — Patient Instructions (Addendum)
   IF you received an x-ray today, you will receive an invoice from [facility]. Please contact [facility] at [phone] with questions or concerns regarding your invoice.   IF you received labwork today, you will receive an invoice from [facility]. Please contact [facility] at [phone] with questions or concerns regarding your invoice.   Our billing staff will not be able to assist you with questions regarding bills from these companies.  You will be contacted with the lab results as soon as they are available. The fastest way to get your results is to activate your My Chart account. Instructions are located on the last page of this paperwork. If you have not heard from us regarding the results in 2 weeks, please contact this office.    We recommend that you schedule a mammogram for breast cancer screening. Typically, you do not need a referral to do this. Please contact a local imaging center to schedule your mammogram.  [facility] - [phone]  *ask for the Radiology Department [facility] ([facility]) - [phone] or [phone]  [facility] - [phone] [facility] - [phone] [facility] - [phone]  *ask for the Radiology Department [facility] - [phone]  *ask for the Radiology Department [facility] - [phone]  *ask for the Mammography Department [facility] - [phone] Fatigue Fatigue is feeling tired all of the time, a lack of energy, or a lack of motivation. Occasional or mild fatigue is often a normal response to activity or life in general. However, long-lasting (chronic) or extreme fatigue may indicate an underlying medical condition. Follow these instructions at home: Watch your fatigue for any changes. The following actions may help to lessen any discomfort you are feeling:  Talk to your health care provider about how much  sleep you need each night. Try to get the required amount every night.  Take medicines only as directed by your health care provider.  Eat a healthy and nutritious diet. Ask your health care provider if you need help changing your diet.  Drink enough fluid to keep your urine clear or pale yellow.  Practice ways of relaxing, such as yoga, meditation, massage therapy, or acupuncture.  Exercise regularly.  Change situations that cause you stress. Try to keep your work and personal routine reasonable.  Do not abuse illegal drugs.  Limit alcohol intake to no more than 1 drink per day for nonpregnant women and 2 drinks per day for men. One drink equals 12 ounces of beer, 5 ounces of wine, or 1 ounces of hard liquor.  Take a multivitamin, if directed by your health care provider.  Contact a health care provider if:  Your fatigue does not get better.  You have a fever.  You have unintentional weight loss or gain.  You have headaches.  You have difficulty: ? Falling asleep. ? Sleeping throughout the night.  You feel angry, guilty, anxious, or sad.  You are unable to have a bowel movement (constipation).  You skin is dry.  Your legs or another part of your body is swollen. Get help right away if:  You feel confused.  Your vision is blurry.  You feel faint or pass out.  You have a severe headache.  You have severe abdominal, pelvic, or back pain.  You have chest pain, shortness of breath, or an irregular or fast heartbeat.  You are unable to urinate or you urinate   less than normal.  You develop abnormal bleeding, such as bleeding from the rectum, vagina, nose, lungs, or nipples.  You vomit blood.  You have thoughts about harming yourself or committing suicide.  You are worried that you might harm someone else. This information is not intended to replace advice given to you by your health care provider. Make sure you discuss any questions you have with your health  care provider. Document Released: 11/27/2006 Document Revised: 07/08/2015 Document Reviewed: 06/03/2013 Elsevier Interactive Patient Education  2018 [employer]vier Inc[employer]   

## 2017-04-12 NOTE — Progress Notes (Signed)
Chief Complaint  Patient presents with  . New Patient (Initial Visit)    establish care.  Pt has concerns about being diabetic, c/o dry mouth. Pt c/o dizziness upon standing and unsure why.    HPI  Narcolepsy Patient reports that she was diagnosed with Narcolepsy She was diagnosed in childhood due to falling asleep and feeling week or having her legs give out on her She states that she is a former patient of Dr. 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  She is here for referral to get back in to see Neurology.  Polyuria, polydipsia, screening for diabetes She has a family history of diabetes She has low energy, dry mouth, increased hunger and thirst, she is also urinating more often She denies nausea and vomiting She states that she did not drink any water for six months because she drank tap water and got sick so she stopped drinking water and could not afford bottled water. She was getting vomiting and diarrhea from drinking sink water.   Severe Depression She has a history of anxiety and depression Depression screen PHQ 2/9 04/12/2017  Decreased Interest 3  Down, Depressed, Hopeless 2  PHQ - 2 Score 5  Altered sleeping 3  Tired, decreased energy 3  Change in appetite 3  Feeling bad or failure about yourself  3  Trouble concentrating 3  Moving slowly or fidgety/restless 3  Suicidal thoughts 1  PHQ-9 Score 24  Difficult doing work/chores Very difficult   She states that she has had depression "since I can remember". She only had one suicide attempt at age 64 - 50  She states that she has always had depression  She uses natural herbs for her moods and drinks hibiscus tea She states that on some of the days she would wonder if "it would be a better life if she was note here".  She states that she does not trust herself to go do "something crazy like kill herself".  She does not act on her thoughts.   Tobacco use  She has been smoking for  20 years In the past 4 years she has been smoking 2.5 packs a day She states that the smoking is worsened by her mood She states that this past year she cut down to 1 pack per day Her son reports that he will help her but not until she cuts down to 1 pack per day She reports that she has a cough She states that she has runny nose and facial congestion and burning  She reports that she does not have a morning cough   Past Medical History:  Diagnosis Date  . Allergy   . Anxiety   . Arthritis   . Depression   . Narcolepsy     Current Outpatient Medications  Medication Sig Dispense Refill  . cetirizine (ZYRTEC) 10 MG tablet Take 1 tablet (10 mg total) by mouth daily. 30 tablet 11  . fluticasone (FLONASE) 50 MCG/ACT nasal spray Place 2 sprays into both nostrils daily. 16 g 6   No current facility-administered medications for this visit.     Allergies:  Allergies  Allergen Reactions  . Aspirin     itch    Past Surgical History:  Procedure Laterality Date  . TUBAL LIGATION      Social History   Socioeconomic History  . Marital status: Married    Spouse name: None  . Number of children: None  . Years  of education: None  . Highest education level: None  Social Needs  . Financial resource strain: None  . Food insecurity - worry: None  . Food insecurity - inability: None  . Transportation needs - medical: None  . Transportation needs - non-medical: None  Occupational History  . None  Tobacco Use  . Smoking status: Current Every Day Smoker    Packs/day: 0.50  . Smokeless tobacco: Never Used  Substance and Sexual Activity  . Alcohol use: Yes  . Drug use: No  . Sexual activity: Yes  Other Topics Concern  . None  Social History Narrative  . None    No family history on file.   Review of Systems  Constitutional: Negative for chills, fever and weight loss.  HENT: Positive for congestion. Negative for sinus pain.   Respiratory: Positive for cough. Negative for  shortness of breath, wheezing and stridor.   Cardiovascular: Negative for chest pain, palpitations and orthopnea.  Gastrointestinal: Negative for abdominal pain, blood in stool, constipation, diarrhea, nausea and vomiting.  Genitourinary: Negative for dysuria, frequency, hematuria and urgency.  Musculoskeletal: Negative for joint pain and myalgias.  Skin: Negative for itching and rash.  Neurological: Negative for dizziness, tingling, tremors and headaches.  Psychiatric/Behavioral: Positive for depression. The patient is not nervous/anxious and does not have insomnia.       Objective: Vitals:   04/12/17 1432  BP: 107/76  Pulse: 78  Resp: 17  Temp: 99.1 F (37.3 C)  TempSrc: Oral  SpO2: 98%  Weight: 172 lb 9.6 oz (78.3 kg)  Height: 5' 6.5" (1.689 m)    Physical Exam  Constitutional: She is oriented to person, place, and time. She appears well-developed and well-nourished.  HENT:  Head: Normocephalic and atraumatic.  Right Ear: External ear normal.  Left Ear: External ear normal.  Nose: Nose normal.  Mouth/Throat: Oropharynx is clear and moist. No oropharyngeal exudate.  Dry mouth   Eyes: Conjunctivae and EOM are normal.  Neck: Normal range of motion. No thyromegaly present.  Cardiovascular: Normal rate, regular rhythm and normal heart sounds.  No murmur heard. Pulmonary/Chest: Effort normal and breath sounds normal. No stridor. No respiratory distress.  Abdominal: Soft. Bowel sounds are normal. She exhibits no distension and no mass. There is no tenderness. There is no guarding.  Musculoskeletal: Normal range of motion. She exhibits no edema.  Neurological: She is alert and oriented to person, place, and time.  Skin: Skin is warm. Capillary refill takes less than 2 seconds. No rash noted. No erythema.  Psychiatric: She has a normal mood and affect. Her behavior is normal. Judgment and thought content normal.    Assessment and Plan Phyliss was seen today for new patient  (initial visit).  Diagnoses and all orders for this visit:  Primary narcolepsy without cataplexy- referral for continuity of care -     Ambulatory referral to Neurology  Chronic fatigue- poor nutrition and smoking contributing Will screen for other causes -     Comprehensive metabolic panel -     CBC -     TSH  Severe depression (HCC)- will check labs and then discuss treatment at the next visit Pt stable for discharge home as she has no active SI -     Comprehensive metabolic panel -     CBC -     TSH  Family history of diabetes mellitus Screening for diabetes mellitus -   Reviewed modifiable risk factors -   Advised pt to return with a log  of what she is eating -     Hemoglobin A1c  Polyuria- discussed screening for diabetes No urine dip collected -     Hemoglobin A1c -     POCT urinalysis dipstick  Impaired fasting glucose -     Hemoglobin A1c  Tobacco use disorder, severe, dependence- smoking cessation discussed.  Maybe benefit from a medication for depression and to help with smoking cessation. Will discuss wellbutrin.   Chronic rhinitis- will treat symptoms with flonase and antihistamine -     fluticasone (FLONASE) 50 MCG/ACT nasal spray; Place 2 sprays into both nostrils daily. -     cetirizine (ZYRTEC) 10 MG tablet; Take 1 tablet (10 mg total) by mouth daily.  Other orders -     Cancel: Flu Vaccine QUAD 36+ mos IM -     Cancel: Tdap vaccine greater than or equal to 7yo IM   A total of 45 minutes were spent face-to-face with the patient during this encounter and over half of that time was spent on counseling and coordination of care.   Megham Dwyer A Ozetta Flatley

## 2017-04-13 ENCOUNTER — Encounter: Payer: Self-pay | Admitting: Family Medicine

## 2017-04-13 LAB — TSH: TSH: 1.83 u[IU]/mL (ref 0.450–4.500)

## 2017-04-13 LAB — CBC
Hematocrit: 38.6 % (ref 34.0–46.6)
Hemoglobin: 12.1 g/dL (ref 11.1–15.9)
MCH: 25.7 pg — ABNORMAL LOW (ref 26.6–33.0)
MCHC: 31.3 g/dL — AB (ref 31.5–35.7)
MCV: 82 fL (ref 79–97)
PLATELETS: 175 10*3/uL (ref 150–379)
RBC: 4.71 x10E6/uL (ref 3.77–5.28)
RDW: 15.3 % (ref 12.3–15.4)
WBC: 9.4 10*3/uL (ref 3.4–10.8)

## 2017-04-13 LAB — COMPREHENSIVE METABOLIC PANEL
ALK PHOS: 112 IU/L (ref 39–117)
ALT: 26 IU/L (ref 0–32)
AST: 18 IU/L (ref 0–40)
Albumin/Globulin Ratio: 1.3 (ref 1.2–2.2)
Albumin: 3.7 g/dL (ref 3.5–5.5)
BILIRUBIN TOTAL: 0.2 mg/dL (ref 0.0–1.2)
BUN / CREAT RATIO: 11 (ref 9–23)
BUN: 8 mg/dL (ref 6–24)
CHLORIDE: 104 mmol/L (ref 96–106)
CO2: 22 mmol/L (ref 20–29)
Calcium: 9 mg/dL (ref 8.7–10.2)
Creatinine, Ser: 0.71 mg/dL (ref 0.57–1.00)
GFR calc non Af Amer: 103 mL/min/{1.73_m2} (ref >59)
GFR, EST AFRICAN AMERICAN: 119 mL/min/{1.73_m2} (ref >59)
GLOBULIN, TOTAL: 2.9 g/dL (ref 1.5–4.5)
Glucose: 102 mg/dL — ABNORMAL HIGH (ref 65–99)
Potassium: 4 mmol/L (ref 3.5–5.2)
SODIUM: 141 mmol/L (ref 134–144)
TOTAL PROTEIN: 6.6 g/dL (ref 6.0–8.5)

## 2017-04-13 LAB — HEMOGLOBIN A1C
ESTIMATED AVERAGE GLUCOSE: 114 mg/dL
HEMOGLOBIN A1C: 5.6 % (ref 4.8–5.6)

## 2017-04-18 DIAGNOSIS — F322 Major depressive disorder, single episode, severe without psychotic features: Secondary | ICD-10-CM | POA: Insufficient documentation

## 2017-04-18 DIAGNOSIS — F17200 Nicotine dependence, unspecified, uncomplicated: Secondary | ICD-10-CM | POA: Insufficient documentation

## 2017-04-18 DIAGNOSIS — G47419 Narcolepsy without cataplexy: Secondary | ICD-10-CM | POA: Insufficient documentation

## 2017-04-18 DIAGNOSIS — R5382 Chronic fatigue, unspecified: Secondary | ICD-10-CM | POA: Insufficient documentation

## 2017-04-18 DIAGNOSIS — J310 Chronic rhinitis: Secondary | ICD-10-CM | POA: Insufficient documentation

## 2017-04-23 ENCOUNTER — Encounter: Payer: Self-pay | Admitting: Family Medicine

## 2017-04-23 ENCOUNTER — Ambulatory Visit (INDEPENDENT_AMBULATORY_CARE_PROVIDER_SITE_OTHER): Payer: Medicare Other | Admitting: Family Medicine

## 2017-04-23 ENCOUNTER — Other Ambulatory Visit: Payer: Self-pay

## 2017-04-23 VITALS — BP 108/74 | HR 101 | Temp 98.7°F | Resp 17 | Ht 66.5 in | Wt 172.2 lb

## 2017-04-23 DIAGNOSIS — G47419 Narcolepsy without cataplexy: Secondary | ICD-10-CM

## 2017-04-23 DIAGNOSIS — M792 Neuralgia and neuritis, unspecified: Secondary | ICD-10-CM

## 2017-04-23 DIAGNOSIS — F17200 Nicotine dependence, unspecified, uncomplicated: Secondary | ICD-10-CM | POA: Diagnosis not present

## 2017-04-23 DIAGNOSIS — F329 Major depressive disorder, single episode, unspecified: Secondary | ICD-10-CM

## 2017-04-23 DIAGNOSIS — F341 Dysthymic disorder: Secondary | ICD-10-CM | POA: Diagnosis not present

## 2017-04-23 DIAGNOSIS — R5382 Chronic fatigue, unspecified: Secondary | ICD-10-CM

## 2017-04-23 MED ORDER — NICOTINE 21 MG/24HR TD PT24: 21.0000 mg | MEDICATED_PATCH | Freq: Every day | TRANSDERMAL | 1 refills | Status: DC

## 2017-04-23 MED ORDER — NICOTINE 14 MG/24HR TD PT24: 14.0000 mg | MEDICATED_PATCH | Freq: Every day | TRANSDERMAL | 0 refills | Status: DC

## 2017-04-23 MED ORDER — NICOTINE 7 MG/24HR TD PT24: 7.0000 mg | MEDICATED_PATCH | Freq: Every day | TRANSDERMAL | 0 refills | Status: DC

## 2017-04-23 NOTE — Progress Notes (Signed)
Chief Complaint  Patient presents with  . Nicotine Dependence    discuss labs, rash on neck and hair falling randomly and not sure why, ? gout testing but comes negative and feet swell and calfs of legs feel swollen.  Feels like weights on legs, intermittently- its been a lifetime issue for her per pt    HPI   Smoking cessation She is now down to one pack per day She was able to cut down on her smoking on her own She states that she does not smoke that much and her smoking has been reduced. She states that she was smoking more due to her bad nerves.    Major Depression  Narcolepsy She states that her depression is related to her Narcolepsy. She was also smoking to stay awake.  She reports that she took antidepressants in the past and she felt suicidal. She reports that she has taken so many but had a very bad reaction with Ritalin which almost made her kill herself.    Depression screen [facility] 2/9 04/23/2017 04/12/2017  Decreased Interest 3 3  Down, Depressed, Hopeless 2 2  PHQ - 2 Score 5 5  Altered sleeping 3 3  Tired, decreased energy 3 3  Change in appetite 3 3  Feeling bad or failure about yourself  2 3  Trouble concentrating 3 3  Moving slowly or fidgety/restless 3 3  Suicidal thoughts 0 1  PHQ-9 Score 22 24  Difficult doing work/chores Somewhat difficult Very difficult     Past Medical History:  Diagnosis Date  . Allergy   . Anxiety   . Arthritis   . Depression   . Narcolepsy     Current Outpatient Medications  Medication Sig Dispense Refill  . cetirizine (ZYRTEC) 10 MG tablet Take 1 tablet (10 mg total) by mouth daily. 30 tablet 11  . fluticasone (FLONASE) 50 MCG/ACT nasal spray Place 2 sprays into both nostrils daily. 16 g 6  . nicotine (NICODERM CQ - DOSED IN MG/24 HOURS) 14 mg/24hr patch Place 1 patch (14 mg total) onto the skin daily. For 2 weeks. Step 2. 14 patch 0  . nicotine (NICODERM CQ - DOSED IN MG/24 HOURS) 21 mg/24hr patch Place 1 patch (21 mg  total) onto the skin daily. For 6 weeks. Step 1 28 patch 1  . nicotine (NICODERM CQ - DOSED IN MG/24 HR) 7 mg/24hr patch Place 1 patch (7 mg total) onto the skin daily. For 2 weeks. Stage 3 14 patch 0   No current facility-administered medications for this visit.     Allergies:  Allergies  Allergen Reactions  . Aspirin     itch    Past Surgical History:  Procedure Laterality Date  . TUBAL LIGATION      Social History   Socioeconomic History  . Marital status: Married    Spouse name: Not on file  . Number of children: Not on file  . Years of education: Not on file  . Highest education level: Not on file  Occupational History  . Not on file  Social Needs  . Financial resource strain: Not on file  . Food insecurity:    Worry: Not on file    Inability: Not on file  . Transportation needs:    Medical: Not on file    Non-medical: Not on file  Tobacco Use  . Smoking status: Current Every Day Smoker    Packs/day: 0.50  . Smokeless tobacco: Never Used  Substance and Sexual Activity  .  Alcohol use: Yes  . Drug use: No  . Sexual activity: Yes  Lifestyle  . Physical activity:    Days per week: Not on file    Minutes per session: Not on file  . Stress: Not on file  Relationships  . Social connections:    Talks on phone: Not on file    Gets together: Not on file    Attends religious service: Not on file    Active member of club or organization: Not on file    Attends meetings of clubs or organizations: Not on file    Relationship status: Not on file  Other Topics Concern  . Not on file  Social History Narrative  . Not on file    No family history on file.   ROS Review of Systems See HPI Constitution: No fevers or chills No malaise No diaphoresis Skin: No rash or itching Eyes: no blurry vision, no double vision GU: no dysuria or hematuria Neuro: no dizziness or headaches all others reviewed and negative   Objective: Vitals:   04/23/17 1559  BP: 108/74   Pulse: (!) 101  Resp: 17  Temp: 98.7 F (37.1 C)  TempSrc: Oral  SpO2: 96%  Weight: 172 lb 3.2 oz (78.1 kg)  Height: 5' 6.5" (1.689 m)    Physical Exam  Constitutional: She is oriented to person, place, and time. She appears well-developed and well-nourished.  HENT:  Head: Normocephalic and atraumatic.  Cardiovascular: Normal rate, regular rhythm and normal heart sounds.  Pulmonary/Chest: Effort normal and breath sounds normal. No stridor. No respiratory distress. She has no wheezes.  Neurological: She is alert and oriented to person, place, and time.  Psychiatric: She has a normal mood and affect. Her behavior is normal. Judgment and thought content normal.    Assessment and Plan Sherry Leonard was seen today for nicotine dependence.  Diagnoses and all orders for this visit:  Primary narcolepsy without cataplexy- referral placed for Neurology Won't try empiric treatment since she has tried so many options  Major Depression- pt has family support and is not currently suicidal  Will not try meds until after her Narcolepsy is treated  Chronic fatigue- likely due to her narcolepsy and worsening by major depression  Tobacco use disorder, severe, dependence- smoking cessation advised  nicoderm discussed as well as how to use the step down program  Nerve pain- referral placed for Neurology  Other orders -     nicotine (NICODERM CQ - DOSED IN MG/24 HOURS) 21 mg/24hr patch; Place 1 patch (21 mg total) onto the skin daily. For 6 weeks. Step 1 -     nicotine (NICODERM CQ - DOSED IN MG/24 HOURS) 14 mg/24hr patch; Place 1 patch (14 mg total) onto the skin daily. For 2 weeks. Step 2. -     nicotine (NICODERM CQ - DOSED IN MG/24 HR) 7 mg/24hr patch; Place 1 patch (7 mg total) onto the skin daily. For 2 weeks. Stage 3     Sherry Leonard A Sherry Leonard

## 2017-04-23 NOTE — Patient Instructions (Addendum)
Nicotine Patch  Transdermal patch: Topical: Note: Adjustment may be required during initial treatment (move to higher dose if experiencing withdrawal symptoms; lower dose if side effects are experienced). Patients smoking >10 cigarettes/day: Begin with step 1 (21 mg/day) for 6 weeks, followed by step 2 (14 mg/day) for 2 weeks; finish with step 3 (7 mg/day) for 2 weeks   Transdermal patch: Apply new patch to nonhairy, clean, dry skin on the upper body or upper outer arm; each patch should be applied to a different site. Apply immediately after removing backing from patch; press onto skin for ~10 seconds. Patch may be worn for 16 or 24 hours. If cigarette cravings occur upon awakening, wear for 24 hours; if vivid dreams or other sleep disturbances occur, remove the patch at bedtime and apply a new patch in the morning. Do not cut patch; causes rapid evaporation, rendering the patch useless. Do not wear more than 1 patch at a time; do not leave patch on for more than 24 hours (may irritate skin). Wash hands after applying or removing patch. Discard patches by folding adhesive ends together, replace in pouch and dispose of properly in trash.    IF you received an x-ray today, you will receive an invoice from [facility]. Please contact [facility] at [phone] with questions or concerns regarding your invoice.   IF you received labwork today, you will receive an invoice from [facility]. Please contact [facility] at [phone] with questions or concerns regarding your invoice.   Our billing staff will not be able to assist you with questions regarding bills from these companies.  You will be contacted with the lab results as soon as they are available. The fastest way to get your results is to activate your My Chart account. Instructions are located on the last page of this paperwork. If you have not heard from us regarding the results in 2 weeks, please contact this office.      Coping with Quitting Smoking Quitting smoking is a physical and mental challenge. You will face cravings, withdrawal symptoms, and temptation. Before quitting, work with your health care provider to make a plan that can help you cope. Preparation can help you quit and keep you from giving in. How can I cope with cravings? Cravings usually last for 5-10 minutes. If you get through it, the craving will pass. Consider taking the following actions to help you cope with cravings:  Keep your mouth busy: ? Chew sugar-free gum. ? Suck on hard candies or a straw. ? Brush your teeth.  Keep your hands and body busy: ? Immediately change to a different activity when you feel a craving. ? Squeeze or play with a ball. ? Do an activity or a hobby, like making bead jewelry, practicing needlepoint, or working with wood. ? Mix up your normal routine. ? Take a short exercise break. Go for a quick walk or run up and down stairs. ? Spend time in public places where smoking is not allowed.  Focus on doing something kind or helpful for someone else.  Call a friend or family member to talk during a craving.  Join a support group.  Call a quit line, such as 1-800-QUIT-NOW.  Talk with your health care provider about medicines that might help you cope with cravings and make quitting easier for you.  How can I deal with withdrawal symptoms? Your body may experience negative effects as it tries to get used to not having nicotine in the system. These effects  are called withdrawal symptoms. They may include:  Feeling hungrier than normal.  Trouble concentrating.  Irritability.  Trouble sleeping.  Feeling depressed.  Restlessness and agitation.  Craving a cigarette.  To manage withdrawal symptoms:  Avoid places, people, and activities that trigger your cravings.  Remember why you want to quit.  Get plenty of sleep.  Avoid coffee and other caffeinated drinks. These may worsen some of your  symptoms.  How can I handle social situations? Social situations can be difficult when you are quitting smoking, especially in the first few weeks. To manage this, you can:  Avoid parties, bars, and other social situations where people might be smoking.  Avoid alcohol.  Leave right away if you have the urge to smoke.  Explain to your family and friends that you are quitting smoking. Ask for understanding and support.  Plan activities with friends or family where smoking is not an option.  What are some ways I can cope with stress? Wanting to smoke may cause stress, and stress can make you want to smoke. Find ways to manage your stress. Relaxation techniques can help. For example:  Breathe slowly and deeply, in through your nose and out through your mouth.  Listen to soothing, relaxing music.  Talk with a family member or friend about your stress.  Light a candle.  Soak in a bath or take a shower.  Think about a peaceful place.  What are some ways I can prevent weight gain? Be aware that many people gain weight after they quit smoking. However, not everyone does. To keep from gaining weight, have a plan in place before you quit and stick to the plan after you quit. Your plan should include:  Having healthy snacks. When you have a craving, it may help to: ? Eat plain popcorn, crunchy carrots, celery, or other cut vegetables. ? Chew sugar-free gum.  Changing how you eat: ? Eat small portion sizes at meals. ? Eat 4-6 small meals throughout the day instead of 1-2 large meals a day. ? Be mindful when you eat. Do not watch television or do other things that might distract you as you eat.  Exercising regularly: ? Make time to exercise each day. If you do not have time for a long workout, do short bouts of exercise for 5-10 minutes several times a day. ? Do some form of strengthening exercise, like weight lifting, and some form of aerobic exercise, like running or  swimming.  Drinking plenty of water or other low-calorie or no-calorie drinks. Drink 6-8 glasses of water daily, or as much as instructed by your health care provider.  Summary  Quitting smoking is a physical and mental challenge. You will face cravings, withdrawal symptoms, and temptation to smoke again. Preparation can help you as you go through these challenges.  You can cope with cravings by keeping your mouth busy (such as by chewing gum), keeping your body and hands busy, and making calls to family, friends, or a helpline for people who want to quit smoking.  You can cope with withdrawal symptoms by avoiding places where people smoke, avoiding drinks with caffeine, and getting plenty of rest.  Ask your health care provider about the different ways to prevent weight gain, avoid stress, and handle social situations. This information is not intended to replace advice given to you by your health care provider. Make sure you discuss any questions you have with your health care provider. Document Released: 01/28/2016 Document Revised: 01/28/2016 Document Reviewed: 01/28/2016  Elsevier Interactive Patient Education  2018 [employer]vier Inc.  

## 2017-05-04 ENCOUNTER — Encounter: Payer: Self-pay | Admitting: Family Medicine

## 2017-06-18 ENCOUNTER — Encounter: Payer: Self-pay | Admitting: Neurology

## 2017-06-18 ENCOUNTER — Ambulatory Visit (INDEPENDENT_AMBULATORY_CARE_PROVIDER_SITE_OTHER): Payer: Medicare Other | Admitting: Neurology

## 2017-06-18 VITALS — BP 103/77 | HR 94 | Ht 66.0 in | Wt 170.0 lb

## 2017-06-18 DIAGNOSIS — F39 Unspecified mood [affective] disorder: Secondary | ICD-10-CM | POA: Diagnosis not present

## 2017-06-18 DIAGNOSIS — G4719 Other hypersomnia: Secondary | ICD-10-CM | POA: Diagnosis not present

## 2017-06-18 DIAGNOSIS — G478 Other sleep disorders: Secondary | ICD-10-CM

## 2017-06-18 DIAGNOSIS — R442 Other hallucinations: Secondary | ICD-10-CM

## 2017-06-18 DIAGNOSIS — G47411 Narcolepsy with cataplexy: Secondary | ICD-10-CM | POA: Diagnosis not present

## 2017-06-18 NOTE — Progress Notes (Addendum)
SLEEP MEDICINE CLINIC   Provider:  Larey Seat, [place] D  Primary Care Physician: Osborn Coho   Referring Provider: Forrest Moron, MD   Chief Complaint  Patient presents with  . New Patient (Initial Visit)    pt alone, rm 10. was established with Dr Erling Cruz with treatment of her narcolepsy. Since Dr Erling Cruz retired she has not followed with a neurologist. pt states not struggling with staying drowsy during the day, she states that now she experiences where she feels she is awake at night but unable to respond. she doesnt feel that she is getting enough sleep.     HPI:  Haley Fuerstenberg is a 47 y.o. female , seen here as in a referral from Dr. Nolon Rod for a new evaluation of sleep,  excessive daytime sleepiness.   I have the pleasure of seeing Mrs.Avie Arenas today, her maiden name was Julaine Fusi, and she had been followed until 2004 by Dr. Morene Antu.  He had first encountered her when she was only 47 years old at the time complaining of symptoms of excessive daytime sleepiness.  EEG and thyroid profile were normal in April and May 1984 and she was subsequently undergoing a sleep test at [facility].  The diagnosis of idiopathic hypersomnolence was made but there was no nap onset REM.  Narcolepsy was not made but suspected.  Subsequently she followed the [facility] from March 1987.  There were extensive cognitive testing.  She also had a chronically elevated CO2 level, which is most likely related to her smoking habit.  She had microcytic hypochromic anemia and again was excessively sleepy at the time.  She was admitted to the child psychiatric unit and had another evaluation now at age 21 in the [facility] lab.  Diagnosis was now made of narcolepsy as she had multiple sleep onset REM episodes.  She did report ongoing irresistible urge to sleep during the day, as a child she had also some cataplectic attacks following laughing, being angry or being startled.  She  had described hypnagogic hallucinations, sleep paralysis as frequently as 3-4 times monthly.  She had some falls that she attributed to cataplexy.  She takes naps 5-6 times a day not scheduled.  She has also migraine headaches and in August 2000 was placed on Provigil, which seems to have given her great benefit her first revisit was then on 24 April 2002.  Dr. love used to working diagnosis of narcolepsy with cataplexy and we started her on Provigil at that visit.  She was asked to keep track of her blood pressures, she was asked to return for reevaluation later that year, but it took her  years -this is her first revisit with [facility] now in 2019, 5 years later.   Chief complaint according to patient : she is untreated for narcolepsy- she feels " edgy" , mood swings, lucid dreams and unable to distinguish these form reality.  She has no psychiatrist at this time.   Sleep habits are as follows:  She has no sleep schedule or routine. Main time any time after 6 o clock,  Rises at 8 AM, naps twice in AM, naps last 120 minutes.  Sometimes she cannot go to sleep. Vivid dreams,     Sleep medical history and family sleep history: narcolepsy diagnosed at age 42, psychological risk factor, hallucinations- related to dreams.  Was on ritalin at age 38 or 22, tried to commit suicide and reportedly  was trying to kill her family - self reported. Vivid hallucinations. MRDD a diagnosed at age 76. Visual and auditory hallucinations.   Social history: continues to smoke.   Talks in her sleep , sleeps with eyes open. Lucid dreams. 1.5 ppd smoker, ETOH - socially,  caffeine - soda without caffeine, 1 coffee on average . She does not drink tea ,  She lost daytime and night time jobs due to sleepiness. She is a grandmother:  70 and 92 year old. Children 6: age from 50-19.    Review of Systems: Out of a complete 14 system review, the patient complains of only the following symptoms, and all other  reviewed systems are negative. Lucid dreams. Sleepiness, alternating with insomnia.    Back pain, hot feeling on the back, burning.  Sleep paralysis. Cataplexy.   Epworth score 23/ 24 !!! , Fatigue severity score 60 points   , depression score n/a    Social History   Socioeconomic History  . Marital status: Married    Spouse name: Not on file  . Number of children: Not on file  . Years of education: Not on file  . Highest education level: Not on file  Occupational History  . Not on file  Social Needs  . Financial resource strain: Not on file  . Food insecurity:    Worry: Not on file    Inability: Not on file  . Transportation needs:    Medical: Not on file    Non-medical: Not on file  Tobacco Use  . Smoking status: Current Every Day Smoker    Packs/day: 1.00  . Smokeless tobacco: Never Used  Substance and Sexual Activity  . Alcohol use: Yes    Comment: ocassional  . Drug use: No  . Sexual activity: Yes  Lifestyle  . Physical activity:    Days per week: Not on file    Minutes per session: Not on file  . Stress: Not on file  Relationships  . Social connections:    Talks on phone: Not on file    Gets together: Not on file    Attends religious service: Not on file    Active member of club or organization: Not on file    Attends meetings of clubs or organizations: Not on file    Relationship status: Not on file  . Intimate partner violence:    Fear of current or ex partner: Not on file    Emotionally abused: Not on file    Physically abused: Not on file    Forced sexual activity: Not on file  Other Topics Concern  . Not on file  Social History Narrative  . Not on file    Family History  Problem Relation Age of Onset  . Cancer Maternal Grandfather    Order: [id number]  Status:  Final result  Visible to patient:  No (Not Released)  Next appt:  10/24/2017 at 03:40 PM in Family Medicine (Forrest Moron, MD)  Dx:  Polyuria; Screening for diabetes mell...    Ref Range & Units 34moago  Hgb A1c MFr Bld 4.8 - 5.6 % 5.6   Comment:     Prediabetes: 5.7 - 6.4      Diabetes: >6.4      Glycemic control for adults with diabetes: <7.0   Est. average glucose Bld gHb Est-mCnc mg/dL 114   Resulting Agency  [facility]    Narrative  Performed by: [facility]  Performed at: [place]  [address] [place], [place] [id number] Lab Director: Rush Farmer MD, Phone: [phone]    Specimen Collected: 04/12/17 16:33 Last Resulted: 04/13/17 05:42     Lab Flowsheet    Order Details    View Encounter    Lab and Collection Details    Routing    Result History          Other Results from 04/12/2017   Contains abnormal data Comprehensive metabolic panel  Order: [id number]   Status:  Final result  Visible to patient:  No (Not Released)  Next appt:  10/24/2017 at 03:40 PM in Family Medicine (Forrest Moron, MD)  Dx:  Chronic fatigue; Severe depression (HCC)   Ref Range & Units 44moago  Glucose 65 - 99 mg/dL 102High    BUN 6 - 24 mg/dL 8   Creatinine, Ser 0.57 - 1.00 mg/dL 0.71   GFR calc non Af Amer >59 mL/min/1.73 103   GFR calc Af Amer >59 mL/min/1.73 119   BUN/Creatinine Ratio 9 - 23 11   Sodium 134 - 144 mmol/L 141   Potassium 3.5 - 5.2 mmol/L 4.0   Chloride 96 - 106 mmol/L 104   CO2 20 - 29 mmol/L 22   Calcium 8.7 - 10.2 mg/dL 9.0   Total Protein 6.0 - 8.5 g/dL 6.6   Albumin 3.5 - 5.5 g/dL 3.7   Globulin, Total 1.5 - 4.5 g/dL 2.9   Albumin/Globulin Ratio 1.2 - 2.2 1.3   Bilirubin Total 0.0 - 1.2 mg/dL 0.2   Alkaline Phosphatase 39 - 117 IU/L 112   AST 0 - 40 IU/L 18   ALT 0 - 32 IU/L 26   Resulting Agency  [facility]    Narrative  Performed by: LMaryan Puls Performed at: [address] [place] [id number] Director: SRush FarmerMD, Phone: [phone]   Specimen Collected: 04/12/17 16:33 Last Resulted: 04/13/17 05:42     Lab Flowsheet    Order Details    View  Encounter    Lab and Collection Details    Routing    Result History            Contains abnormal data CBC  Order: [id number]  Status:  Final result  Visible to patient:  No (Not Released)  Next appt:  10/24/2017 at 03:40 PM in Family Medicine (ZForrest Moron MD)  Dx:  Chronic fatigue; Severe depression (HClark   Ref Range & Units 270mogo  WBC 3.4 - 10.8 x10E3/uL 9.4   RBC 3.77 - 5.28 x10E6/uL 4.71   Hemoglobin 11.1 - 15.9 g/dL 12.1   Hematocrit 34.0 - 46.6 % 38.6   MCV 79 - 97 fL 82   MCH 26.6 - 33.0 pg 25.7Low    MCHC 31.5 - 35.7 g/dL 31.3Low    RDW 12.3 - 15.4 % 15.3   Platelets 150 - 379 x10E3/uL 175   Resulting Agency  [facility]    Narrative  Performed by: LaMaryan PulsPerformed at: [id number] Director: SaRush FarmerD, Phone: [phone]  Specimen Collected: 04/12/17 16:33 Last Resulted: 04/13/17 05:42     Lab Flowsheet    Order Details    View Encounter    Lab and Collection Details    Routing    Result History            TSH  Order: [id number] Status:  Final result  Visible to patient:  No (Not Released)  Next appt:  10/24/2017 at 03:40 PM in Family Medicine (Forrest Moron, MD)  Dx:  Chronic fatigue; Severe depression ([place])   Ref Range & Units 90moago  TSH 0.450 - 4.500 uIU/mL 1.830   Resulting Agency  [facility]    Narrative  Performed by: [facility]  Performed at: [address] [id number] Director: SRush FarmerMD, Phone: [phone]   Specimen Collected: 04/12/17 16:33 Last Resulted: 04/13/17 05:42     Lab Flowsheet    Order Details    View Encounter    Lab and Collection Details    Routing    Result History          All Reviewers List   SForrest Moron MD on 04/13/2017 13:46  Encounter   View Encounter       Result Information   Status: Final result (Collected: 04/12/2017 16:33) Provider Status:  Reviewed    Lab Information   [facility]    Order-Level Documents:   There are no order-level documents.  View SmartLink Info   Hemoglobin A1c (Order ##[id number] on 04/12/17  Result Read / Acknowledged   Acknowledge result  User Time Read / AShon Baton MD 04/13/2017 1:46 PM      Past Medical History:  Diagnosis Date  . Allergy   . Anxiety   . Arthritis   . Depression   . Narcolepsy     Past Surgical History:  Procedure Laterality Date  . TUBAL LIGATION      Current Outpatient Medications  Medication Sig Dispense Refill  . cetirizine (ZYRTEC) 10 MG tablet Take 1 tablet (10 mg total) by mouth daily. 30 tablet 11  . fluticasone (FLONASE) 50 MCG/ACT nasal spray Place 2 sprays into both nostrils daily. 16 g 6   No current facility-administered medications for this visit.     Allergies as of 06/18/2017 - Review Complete 06/18/2017  Allergen Reaction Noted  . Aspirin  09/30/2013    Vitals: BP 103/77   Pulse 94   Ht '5\' 6"'$  (1.676 m)   Wt 170 lb (77.1 kg)   BMI 27.44 kg/m  Last Weight:  Wt Readings from Last 1 Encounters:  06/18/17 170 lb (77.1 kg)   BIHK:VQQVmass index is 27.44 kg/m.     Last Height:   Ht Readings from Last 1 Encounters:  06/18/17 '5\' 6"'$  (1.676 m)    Physical exam:  General: The patient is awake, alert and appears not in acute distress. The patient is well groomed. Head: Normocephalic, atraumatic. Neck is supple. Mallampati 2 ,  neck circumference: 15. Nasal airflow patent , Retrognathia is seen.  Cardiovascular:  Regular rate and rhythm, without  murmurs or carotid bruit, and without distended neck veins. Respiratory: Lungs are clear to auscultation. Skin:  Without evidence of edema, or rash Trunk: stooped posture,    Attention span & concentration ability appears normal.  Speech is fluent,  without  dysarthria, dysphonia or aphasia.  Mood and affect are anxious.  Cranial nerves: Pupils are equal and briskly  reactive to light. Funduscopic exam without  evidence of pallor or edema. Extraocular movements  in vertical and horizontal planes intact and without nystagmus. Visual fields by finger perimetry are intact. Hearing to finger rub intact.  Facial sensation intact to fine touch. Facial motor strength is symmetric and tongue and uvula move midline. Shoulder shrug was symmetrical.  Motor exam:   Normal tone, muscle bulk and  symmetric strength in all extremities. Sensory:  Fine touch, pinprick and vibration were tested in all extremities. Proprioception tested in the upper extremities was normal. Coordination: Rapid alternating movements in the fingers/hands was normal. Finger-to-nose maneuver  normal without evidence of ataxia, dysmetria or tremor. Gait and station: Patient walks without assistive device and is able unassisted to climb up to the exam table. Strength within normal limits. Stance is stable and normal. Turns with 3 Steps. Romberg testing is negative.  Deep tendon reflexes: in the  upper and lower extremities are symmetric and intact. Babinski maneuver response is  downgoing.  Assessment:  After physical and neurologic examination, review of laboratory studies,  Personal review of imaging studies, reports of other /same  Imaging studies, results of polysomnography and / or neurophysiology testing and pre-existing records as far as provided in visit., my assessment is :  The computer system has no records of this patient, neither EEG, MRI nor PSG. Mrs. Rekowski is today referred for evaluation of changing sleep habits she describes that some nights now she feels she has insomnia difficulties to go to sleep she may be very worried has ruminating thoughts higher level of anxiety is also described.  Most days at night however she has a hard time staying awake and controlling her sleepiness.  As described by Dr. love and quoted in today's text, this patient has been diagnosed with narcolepsy at age 29  after a previous sleep test at age 40 was negative.  She is not on any stimulant medication at this time and she states that she is not wanting to take any after she had a psychotic episode on Ritalin.  She did tolerate armodafinil rather well.  She is not on antidepressants, no REM suppressant medication.   1)  Insomnia, hypersomnia, narcolepsy and cataplexy.  Mrs. Wareing has described cataplectic attacks, the irresistible urge to go to sleep, sleep paralysis frequently, dream intrusion hypnagogic and hypnopompic hallucinations which involved auditory and visual features.  She has also clearly described cataplexy.  Since her last sleep test has been 15 years ago I will order a baseline polysomnogram followed by an M SLT.  Since she is not on REM suppressant medication that should be possible within the next 3 weeks.  2)  psychiatric co-morbidities. " Mood disorder", easily crying.   3) headaches, nausea and phonophobia,  Migraine likely.    The patient was advised of the nature of the diagnosed disorder , the treatment options and the  risks for general health and wellness arising from not treating the condition.   I spent more than 55 minutes of face to face time with the patient.  Greater than 50% of time was spent in counseling and coordination of care. We have discussed the diagnosis and differential and I answered the patient's questions.    Plan:  Treatment plan and additional workup :  PSG with expanded EEG montage, followed by MSLT.  We can start Modafinil after the tests are completed, and I would like to start tofranil for cataplexy- but she neds to be followed by a psychiatrists for depression, anxiety and mood swing. Needs help with smoking cessation.  Marland Kitchen  HLA test narcolepsy today.  Marland Kitchen    Larey Seat, MD 05/20/4257, 5:63 PM  Certified in Neurology by ABPN Certified in [place] by [facility] [address] [place], [place]  [address]

## 2017-06-18 NOTE — Patient Instructions (Signed)
You have been diagnosed with a  condition called narcolepsy:  This means, that you have a sleep disorder that manifests with at times severe excessive sleepiness during the day and often with problems with sleep at night. We may have to try different medications that may help you stay awake during the day. Not everything works with everybody the same way. Wake promoting agents include stimulants and non-stimulant type medications. The most common side effects with stimulants are weight loss, insomnia, nervousness, headaches, palpitations, rise in blood pressure, anxiety. Stimulants can be addictive and subject to abuse. Non-stimulant type wake promoting medications include Provigil and Nuvigil, most common side effects include headaches, nervousness, insomnia, hypertension. In addition there is a medication called Xyrem which has been proven to be very effective in patients with narcolepsy with or without cataplexy. Some patients with narcolepsy report episodes of weakness, such as jaw or facial weakness, legs giving out, feeling wobbly or like "Jell-o", etc. in situations of anxiety, stress, laughter, sudden sadness, surprise, etc., which is called cataplexy. You can also experience episodes of sleep paralysis during which you may feel unable to move upon awakening. Some people experience dreamlike sequences upon awakening or upon drifting off to sleep, called hypnopompic or hypnagogic hallucinations.  

## 2017-06-25 LAB — NARCOLEPSY EVALUATION
HLA-DQ ALPHA: NEGATIVE
HLA-DQ BETA: NEGATIVE

## 2017-06-27 ENCOUNTER — Telehealth: Payer: Self-pay | Admitting: Neurology

## 2017-06-27 NOTE — Telephone Encounter (Signed)
Attempted to call the pt and inform her of her result. The number didn't ring, it went straight to a message stating that the person was not accepting calls at this time. I was unable to LVM for the pt.

## 2017-06-27 NOTE — Telephone Encounter (Signed)
-----  Message from Larey Seat, MD sent at 06/25/2017  1:38 PM EDT ----- Negative HLA narcolepsy test.

## 2017-07-06 NOTE — Telephone Encounter (Signed)
Attempted to call pt again to review results. Phone rang once, immediately received message that the "person you are trying to call cannot accept calls at this time". Unable to leave a message.

## 2017-07-11 ENCOUNTER — Encounter: Payer: Self-pay | Admitting: Neurology

## 2017-07-11 NOTE — Telephone Encounter (Signed)
Attempted to call the pt for a 3rd time. Same message as before. Will send a letter for the pt to contact us.

## 2017-10-24 ENCOUNTER — Ambulatory Visit: Payer: Medicare Other | Admitting: Family Medicine

## 2017-10-24 NOTE — Progress Notes (Deleted)
  No chief complaint on file.   HPI  4 review of systems  Past Medical History:  Diagnosis Date  . Allergy   . Anxiety   . Arthritis   . Depression   . Narcolepsy     Current Outpatient Medications  Medication Sig Dispense Refill  . cetirizine (ZYRTEC) 10 MG tablet Take 1 tablet (10 mg total) by mouth daily. 30 tablet 11  . fluticasone (FLONASE) 50 MCG/ACT nasal spray Place 2 sprays into both nostrils daily. 16 g 6   No current facility-administered medications for this visit.     Allergies:  Allergies  Allergen Reactions  . Aspirin     itch    Past Surgical History:  Procedure Laterality Date  . TUBAL LIGATION      Social History   Socioeconomic History  . Marital status: Married    Spouse name: Not on file  . Number of children: Not on file  . Years of education: Not on file  . Highest education level: Not on file  Occupational History  . Not on file  Social Needs  . Financial resource strain: Not on file  . Food insecurity:    Worry: Not on file    Inability: Not on file  . Transportation needs:    Medical: Not on file    Non-medical: Not on file  Tobacco Use  . Smoking status: Current Every Day Smoker    Packs/day: 1.00  . Smokeless tobacco: Never Used  Substance and Sexual Activity  . Alcohol use: Yes    Comment: ocassional  . Drug use: No  . Sexual activity: Yes  Lifestyle  . Physical activity:    Days per week: Not on file    Minutes per session: Not on file  . Stress: Not on file  Relationships  . Social connections:    Talks on phone: Not on file    Gets together: Not on file    Attends religious service: Not on file    Active member of club or organization: Not on file    Attends meetings of clubs or organizations: Not on file    Relationship status: Not on file  Other Topics Concern  . Not on file  Social History Narrative  . Not on file    Family History  Problem Relation Age of Onset  . Cancer Maternal Grandfather       ROS Review of Systems See HPI Constitution: No fevers or chills No malaise No diaphoresis Skin: No rash or itching Eyes: no blurry vision, no double vision GU: no dysuria or hematuria Neuro: no dizziness or headaches ***all others reviewed and negative   Objective: There were no vitals filed for this visit.  Physical Exam  Assessment and Plan There are no diagnoses linked to this encounter.   Ricarda Atayde A Tim Wilhide

## 2017-10-31 DIAGNOSIS — E559 Vitamin D deficiency, unspecified: Secondary | ICD-10-CM | POA: Diagnosis not present

## 2017-10-31 DIAGNOSIS — M542 Cervicalgia: Secondary | ICD-10-CM | POA: Diagnosis not present

## 2017-10-31 DIAGNOSIS — Z79899 Other long term (current) drug therapy: Secondary | ICD-10-CM | POA: Diagnosis not present

## 2017-10-31 DIAGNOSIS — M545 Low back pain: Secondary | ICD-10-CM | POA: Diagnosis not present

## 2017-10-31 DIAGNOSIS — M25561 Pain in right knee: Secondary | ICD-10-CM | POA: Diagnosis not present

## 2017-10-31 DIAGNOSIS — M129 Arthropathy, unspecified: Secondary | ICD-10-CM | POA: Diagnosis not present

## 2017-10-31 DIAGNOSIS — M546 Pain in thoracic spine: Secondary | ICD-10-CM | POA: Diagnosis not present

## 2017-11-06 DIAGNOSIS — M25561 Pain in right knee: Secondary | ICD-10-CM | POA: Diagnosis not present

## 2017-11-06 DIAGNOSIS — M546 Pain in thoracic spine: Secondary | ICD-10-CM | POA: Diagnosis not present

## 2017-11-06 DIAGNOSIS — M545 Low back pain: Secondary | ICD-10-CM | POA: Diagnosis not present

## 2017-11-06 DIAGNOSIS — M25551 Pain in right hip: Secondary | ICD-10-CM | POA: Diagnosis not present

## 2017-11-06 DIAGNOSIS — M25552 Pain in left hip: Secondary | ICD-10-CM | POA: Diagnosis not present

## 2017-11-06 DIAGNOSIS — M542 Cervicalgia: Secondary | ICD-10-CM | POA: Diagnosis not present

## 2017-11-06 DIAGNOSIS — M25562 Pain in left knee: Secondary | ICD-10-CM | POA: Diagnosis not present

## 2017-11-07 DIAGNOSIS — M79604 Pain in right leg: Secondary | ICD-10-CM | POA: Diagnosis not present

## 2017-11-07 DIAGNOSIS — M79605 Pain in left leg: Secondary | ICD-10-CM | POA: Diagnosis not present

## 2017-11-07 DIAGNOSIS — R600 Localized edema: Secondary | ICD-10-CM | POA: Diagnosis not present

## 2017-11-14 DIAGNOSIS — M542 Cervicalgia: Secondary | ICD-10-CM | POA: Diagnosis not present

## 2017-11-14 DIAGNOSIS — M545 Low back pain: Secondary | ICD-10-CM | POA: Diagnosis not present

## 2017-11-14 DIAGNOSIS — Z79899 Other long term (current) drug therapy: Secondary | ICD-10-CM | POA: Diagnosis not present

## 2017-11-14 DIAGNOSIS — M25562 Pain in left knee: Secondary | ICD-10-CM | POA: Diagnosis not present

## 2017-11-14 DIAGNOSIS — M25561 Pain in right knee: Secondary | ICD-10-CM | POA: Diagnosis not present

## 2017-11-22 DIAGNOSIS — Z87891 Personal history of nicotine dependence: Secondary | ICD-10-CM | POA: Diagnosis not present

## 2017-11-22 DIAGNOSIS — F17200 Nicotine dependence, unspecified, uncomplicated: Secondary | ICD-10-CM | POA: Diagnosis not present

## 2017-11-22 DIAGNOSIS — R0602 Shortness of breath: Secondary | ICD-10-CM | POA: Diagnosis not present

## 2017-11-22 DIAGNOSIS — M79604 Pain in right leg: Secondary | ICD-10-CM | POA: Diagnosis not present

## 2017-11-22 DIAGNOSIS — E559 Vitamin D deficiency, unspecified: Secondary | ICD-10-CM | POA: Diagnosis not present

## 2017-11-22 DIAGNOSIS — R0989 Other specified symptoms and signs involving the circulatory and respiratory systems: Secondary | ICD-10-CM | POA: Diagnosis not present

## 2017-11-22 DIAGNOSIS — M79605 Pain in left leg: Secondary | ICD-10-CM | POA: Diagnosis not present

## 2017-11-22 DIAGNOSIS — R5383 Other fatigue: Secondary | ICD-10-CM | POA: Diagnosis not present

## 2017-11-22 DIAGNOSIS — N951 Menopausal and female climacteric states: Secondary | ICD-10-CM | POA: Diagnosis not present

## 2017-11-22 DIAGNOSIS — Z0000 Encounter for general adult medical examination without abnormal findings: Secondary | ICD-10-CM | POA: Diagnosis not present

## 2017-11-27 ENCOUNTER — Ambulatory Visit: Payer: Medicare Other | Admitting: Family Medicine

## 2017-11-27 NOTE — Progress Notes (Deleted)
  No chief complaint on file.   HPI  4 review of systems  Past Medical History:  Diagnosis Date  . Allergy   . Anxiety   . Arthritis   . Depression   . Narcolepsy     Current Outpatient Medications  Medication Sig Dispense Refill  . cetirizine (ZYRTEC) 10 MG tablet Take 1 tablet (10 mg total) by mouth daily. 30 tablet 11  . fluticasone (FLONASE) 50 MCG/ACT nasal spray Place 2 sprays into both nostrils daily. 16 g 6   No current facility-administered medications for this visit.     Allergies:  Allergies  Allergen Reactions  . Aspirin     itch    Past Surgical History:  Procedure Laterality Date  . TUBAL LIGATION      Social History   Socioeconomic History  . Marital status: Married    Spouse name: Not on file  . Number of children: Not on file  . Years of education: Not on file  . Highest education level: Not on file  Occupational History  . Not on file  Social Needs  . Financial resource strain: Not on file  . Food insecurity:    Worry: Not on file    Inability: Not on file  . Transportation needs:    Medical: Not on file    Non-medical: Not on file  Tobacco Use  . Smoking status: Current Every Day Smoker    Packs/day: 1.00  . Smokeless tobacco: Never Used  Substance and Sexual Activity  . Alcohol use: Yes    Comment: ocassional  . Drug use: No  . Sexual activity: Yes  Lifestyle  . Physical activity:    Days per week: Not on file    Minutes per session: Not on file  . Stress: Not on file  Relationships  . Social connections:    Talks on phone: Not on file    Gets together: Not on file    Attends religious service: Not on file    Active member of club or organization: Not on file    Attends meetings of clubs or organizations: Not on file    Relationship status: Not on file  Other Topics Concern  . Not on file  Social History Narrative  . Not on file    Family History  Problem Relation Age of Onset  . Cancer Maternal Grandfather       ROS Review of Systems See HPI Constitution: No fevers or chills No malaise No diaphoresis Skin: No rash or itching Eyes: no blurry vision, no double vision GU: no dysuria or hematuria Neuro: no dizziness or headaches * all others reviewed and negative   Objective: There were no vitals filed for this visit.  Physical Exam  Assessment and Plan There are no diagnoses linked to this encounter.   Delores P [employer]

## 2017-11-28 DIAGNOSIS — M542 Cervicalgia: Secondary | ICD-10-CM | POA: Diagnosis not present

## 2017-11-28 DIAGNOSIS — Z79899 Other long term (current) drug therapy: Secondary | ICD-10-CM | POA: Diagnosis not present

## 2017-11-28 DIAGNOSIS — M545 Low back pain: Secondary | ICD-10-CM | POA: Diagnosis not present

## 2017-11-28 DIAGNOSIS — M546 Pain in thoracic spine: Secondary | ICD-10-CM | POA: Diagnosis not present

## 2017-11-28 DIAGNOSIS — M129 Arthropathy, unspecified: Secondary | ICD-10-CM | POA: Diagnosis not present

## 2017-12-12 DIAGNOSIS — M542 Cervicalgia: Secondary | ICD-10-CM | POA: Diagnosis not present

## 2017-12-12 DIAGNOSIS — M545 Low back pain: Secondary | ICD-10-CM | POA: Diagnosis not present

## 2017-12-12 DIAGNOSIS — R9431 Abnormal electrocardiogram [ECG] [EKG]: Secondary | ICD-10-CM | POA: Diagnosis not present

## 2017-12-12 DIAGNOSIS — R0602 Shortness of breath: Secondary | ICD-10-CM | POA: Diagnosis not present

## 2017-12-12 DIAGNOSIS — Z79899 Other long term (current) drug therapy: Secondary | ICD-10-CM | POA: Diagnosis not present

## 2017-12-12 DIAGNOSIS — M546 Pain in thoracic spine: Secondary | ICD-10-CM | POA: Diagnosis not present

## 2017-12-14 DIAGNOSIS — F17200 Nicotine dependence, unspecified, uncomplicated: Secondary | ICD-10-CM | POA: Diagnosis not present

## 2017-12-14 DIAGNOSIS — E559 Vitamin D deficiency, unspecified: Secondary | ICD-10-CM | POA: Diagnosis not present

## 2017-12-14 DIAGNOSIS — L299 Pruritus, unspecified: Secondary | ICD-10-CM | POA: Diagnosis not present

## 2017-12-14 DIAGNOSIS — L659 Nonscarring hair loss, unspecified: Secondary | ICD-10-CM | POA: Diagnosis not present

## 2017-12-14 DIAGNOSIS — Z87891 Personal history of nicotine dependence: Secondary | ICD-10-CM | POA: Diagnosis not present

## 2017-12-26 DIAGNOSIS — M545 Low back pain: Secondary | ICD-10-CM | POA: Diagnosis not present

## 2017-12-26 DIAGNOSIS — M546 Pain in thoracic spine: Secondary | ICD-10-CM | POA: Diagnosis not present

## 2017-12-26 DIAGNOSIS — M542 Cervicalgia: Secondary | ICD-10-CM | POA: Diagnosis not present

## 2017-12-26 DIAGNOSIS — Z79899 Other long term (current) drug therapy: Secondary | ICD-10-CM | POA: Diagnosis not present

## 2018-01-09 DIAGNOSIS — Z79899 Other long term (current) drug therapy: Secondary | ICD-10-CM | POA: Diagnosis not present

## 2018-01-09 DIAGNOSIS — M542 Cervicalgia: Secondary | ICD-10-CM | POA: Diagnosis not present

## 2018-01-09 DIAGNOSIS — M545 Low back pain: Secondary | ICD-10-CM | POA: Diagnosis not present

## 2018-01-09 DIAGNOSIS — M546 Pain in thoracic spine: Secondary | ICD-10-CM | POA: Diagnosis not present

## 2018-01-23 DIAGNOSIS — Z79899 Other long term (current) drug therapy: Secondary | ICD-10-CM | POA: Diagnosis not present

## 2018-01-23 DIAGNOSIS — M545 Low back pain: Secondary | ICD-10-CM | POA: Diagnosis not present

## 2018-01-23 DIAGNOSIS — M546 Pain in thoracic spine: Secondary | ICD-10-CM | POA: Diagnosis not present

## 2018-01-23 DIAGNOSIS — M542 Cervicalgia: Secondary | ICD-10-CM | POA: Diagnosis not present

## 2018-02-05 DIAGNOSIS — M79605 Pain in left leg: Secondary | ICD-10-CM | POA: Diagnosis not present

## 2018-02-05 DIAGNOSIS — M79604 Pain in right leg: Secondary | ICD-10-CM | POA: Diagnosis not present

## 2018-02-05 DIAGNOSIS — M545 Low back pain: Secondary | ICD-10-CM | POA: Diagnosis not present

## 2018-02-05 DIAGNOSIS — Z79899 Other long term (current) drug therapy: Secondary | ICD-10-CM | POA: Diagnosis not present

## 2018-03-07 DIAGNOSIS — Z79899 Other long term (current) drug therapy: Secondary | ICD-10-CM | POA: Diagnosis not present

## 2018-03-07 DIAGNOSIS — M546 Pain in thoracic spine: Secondary | ICD-10-CM | POA: Diagnosis not present

## 2018-03-07 DIAGNOSIS — M542 Cervicalgia: Secondary | ICD-10-CM | POA: Diagnosis not present

## 2018-03-07 DIAGNOSIS — M79604 Pain in right leg: Secondary | ICD-10-CM | POA: Diagnosis not present

## 2018-03-07 DIAGNOSIS — M79605 Pain in left leg: Secondary | ICD-10-CM | POA: Diagnosis not present

## 2018-03-11 DIAGNOSIS — E7800 Pure hypercholesterolemia, unspecified: Secondary | ICD-10-CM | POA: Diagnosis not present

## 2018-03-11 DIAGNOSIS — R5383 Other fatigue: Secondary | ICD-10-CM | POA: Diagnosis not present

## 2018-03-11 DIAGNOSIS — R05 Cough: Secondary | ICD-10-CM | POA: Diagnosis not present

## 2018-03-11 DIAGNOSIS — E559 Vitamin D deficiency, unspecified: Secondary | ICD-10-CM | POA: Diagnosis not present

## 2018-03-11 DIAGNOSIS — E1165 Type 2 diabetes mellitus with hyperglycemia: Secondary | ICD-10-CM | POA: Diagnosis not present

## 2018-03-11 DIAGNOSIS — J209 Acute bronchitis, unspecified: Secondary | ICD-10-CM | POA: Diagnosis not present

## 2018-03-25 DIAGNOSIS — F17200 Nicotine dependence, unspecified, uncomplicated: Secondary | ICD-10-CM | POA: Diagnosis not present

## 2018-03-25 DIAGNOSIS — J209 Acute bronchitis, unspecified: Secondary | ICD-10-CM | POA: Diagnosis not present

## 2018-03-25 DIAGNOSIS — Z87891 Personal history of nicotine dependence: Secondary | ICD-10-CM | POA: Diagnosis not present

## 2018-04-08 DIAGNOSIS — Z79899 Other long term (current) drug therapy: Secondary | ICD-10-CM | POA: Diagnosis not present

## 2018-04-08 DIAGNOSIS — M79605 Pain in left leg: Secondary | ICD-10-CM | POA: Diagnosis not present

## 2018-04-08 DIAGNOSIS — M79604 Pain in right leg: Secondary | ICD-10-CM | POA: Diagnosis not present

## 2018-04-08 DIAGNOSIS — M546 Pain in thoracic spine: Secondary | ICD-10-CM | POA: Diagnosis not present

## 2018-04-08 DIAGNOSIS — M542 Cervicalgia: Secondary | ICD-10-CM | POA: Diagnosis not present

## 2018-05-06 DIAGNOSIS — M545 Low back pain: Secondary | ICD-10-CM | POA: Diagnosis not present

## 2018-05-06 DIAGNOSIS — Z79899 Other long term (current) drug therapy: Secondary | ICD-10-CM | POA: Diagnosis not present

## 2018-05-06 DIAGNOSIS — M542 Cervicalgia: Secondary | ICD-10-CM | POA: Diagnosis not present

## 2018-06-06 DIAGNOSIS — Z9189 Other specified personal risk factors, not elsewhere classified: Secondary | ICD-10-CM | POA: Diagnosis not present

## 2018-06-06 DIAGNOSIS — M542 Cervicalgia: Secondary | ICD-10-CM | POA: Diagnosis not present

## 2018-06-06 DIAGNOSIS — M545 Low back pain: Secondary | ICD-10-CM | POA: Diagnosis not present

## 2020-11-04 ENCOUNTER — Institutional Professional Consult (permissible substitution): Payer: Medicare Other | Admitting: Pulmonary Disease

## 2021-07-13 ENCOUNTER — Ambulatory Visit (HOSPITAL_COMMUNITY)
Admission: EM | Admit: 2021-07-13 | Discharge: 2021-07-13 | Disposition: A | Discharge status: Home / Self Care | Payer: Medicare Other | Attending: Family Medicine | Admitting: Family Medicine

## 2021-07-13 ENCOUNTER — Other Ambulatory Visit: Payer: Self-pay

## 2021-07-13 ENCOUNTER — Encounter (HOSPITAL_COMMUNITY): Payer: Self-pay | Admitting: Emergency Medicine

## 2021-07-13 DIAGNOSIS — J310 Chronic rhinitis: Secondary | ICD-10-CM

## 2021-07-13 DIAGNOSIS — J302 Other seasonal allergic rhinitis: Secondary | ICD-10-CM | POA: Diagnosis not present

## 2021-07-13 DIAGNOSIS — R051 Acute cough: Secondary | ICD-10-CM

## 2021-07-13 MED ORDER — PROMETHAZINE-DM 6.25-15 MG/5ML PO SYRP: 5.0000 mL | ORAL_SOLUTION | Freq: Three times a day (TID) | ORAL | 0 refills | Status: DC | PRN

## 2021-07-13 MED ORDER — FLUTICASONE PROPIONATE 50 MCG/ACT NA SUSP: 2.0000 | Freq: Every day | NASAL | 6 refills | Status: DC

## 2021-07-13 MED ORDER — ACETAMINOPHEN 325 MG PO TABS
650.0000 mg | ORAL_TABLET | Freq: Once | ORAL | Status: AC
  Administered 2021-07-13: 650 mg via ORAL

## 2021-07-13 MED ORDER — ACETAMINOPHEN 325 MG PO TABS
ORAL_TABLET | ORAL | Status: AC
  Filled 2021-07-13: qty 2

## 2021-07-13 MED ORDER — CETIRIZINE HCL 10 MG PO TABS: 10.0000 mg | ORAL_TABLET | Freq: Every day | ORAL | 11 refills | Status: DC

## 2021-07-13 NOTE — Discharge Instructions (Signed)
Take Tylenol as needed for headache pain. Resume daily allergy medications. Promethazine DM I have prescribed for cough.

## 2021-07-13 NOTE — ED Provider Notes (Incomplete)
[facility]    CSN: [id number] Arrival date & time: 07/13/21  1455      History   Chief Complaint Chief Complaint  Patient presents with  . Cough    HPI Sherry Leonard is a 51 y.o. female.   HPI Patient with a known history of chronic allergic rhinitis, presents for headache, ears itching, runny nose and headache. Patient has papule right upper eye lid.  Past Medical History:  Diagnosis Date  . Allergy   . Anxiety   . Arthritis   . Depression   . Narcolepsy     Patient Active Problem List   Diagnosis Date Noted  . Primary narcolepsy without cataplexy 04/18/2017  . Chronic fatigue 04/18/2017  . Severe depression (HCC) 04/18/2017  . Tobacco use disorder, severe, dependence 04/18/2017  . Chronic rhinitis 04/18/2017    Past Surgical History:  Procedure Laterality Date  . TUBAL LIGATION      OB History   No obstetric history on file.      Home Medications    Prior to Admission medications   Medication Sig Start Date End Date Taking? Authorizing Provider  cetirizine (ZYRTEC) 10 MG tablet Take 1 tablet (10 mg total) by mouth daily. 04/12/17   Doristine Bosworth, MD  fluticasone (FLONASE) 50 MCG/ACT nasal spray Place 2 sprays into both nostrils daily. 04/12/17   Doristine Bosworth, MD    Family History Family History  Problem Relation Age of Onset  . Cancer Maternal Grandfather     Social History Social History   Tobacco Use  . Smoking status: Every Day    Packs/day: 1.00    Types: Cigarettes  . Smokeless tobacco: Never  Substance Use Topics  . Alcohol use: Yes    Comment: ocassional  . Drug use: No     Allergies   Aspirin   Review of Systems Review of Systems   Physical Exam Triage Vital Signs ED Triage Vitals [07/13/21 1557]  Enc Vitals Group     BP      Pulse      Resp      Temp      Temp src      SpO2      Weight      Height      Head Circumference      Peak Flow      Pain Score 8     Pain Loc      Pain Edu?       Excl. in GC?    No data found.  Updated Vital Signs There were no vitals taken for this visit.  Visual Acuity Right Eye Distance:   Left Eye Distance:   Bilateral Distance:    Right Eye Near:   Left Eye Near:    Bilateral Near:     Physical Exam   UC Treatments / Results  Labs (all labs ordered are listed, but only abnormal results are displayed) Labs Reviewed - No data to display  EKG   Radiology No results found.  Procedures Procedures (including critical care time)  Medications Ordered in UC Medications - No data to display  Initial Impression / Assessment and Plan / UC Course  I have reviewed the triage vital signs and the nursing notes.  Pertinent labs & imaging results that were available during my care of the patient were reviewed by me and considered in my medical decision making (see chart for details).     *** Final Clinical Impressions(s) /  UC Diagnoses   Final diagnoses:  None   Discharge Instructions   None    ED Prescriptions   None    PDMP not reviewed this encounter.

## 2021-07-13 NOTE — ED Triage Notes (Addendum)
Onset of allergy symptoms for 2-3 days.  Headache started at 2 am, ears itching, nose running and has a headache.    Patient has a knot in right upper eye lid for a week.  Reports it is smaller than it was

## 2021-07-13 NOTE — ED Notes (Signed)
Called from lobby, no answer. Called mobile phone on file, no answer.

## 2021-07-13 NOTE — ED Provider Notes (Signed)
[facility]    CSN: [id number] Arrival date & time: 07/13/21  1455      History   Chief Complaint Chief Complaint  Patient presents with   Cough    HPI Sherry Leonard is a 51 y.o. female.   HPI Patient with a known history of chronic allergic rhinitis, presents for headache, ears itching, runny nose and headache. Patient has papule right upper eye lid.  Headache frontal region of the head developed this morning.  Upon awakening she also developed worsening of runny nose, bilateral ear itching, and eye itching.  She has been prescribed allergy medication in the past for the symptoms however has not been taking as she ran out of the medication.  She denies fever, shortness of breath, wheezing, or chest tightness. Past Medical History:  Diagnosis Date   Allergy    Anxiety    Arthritis    Depression    Narcolepsy     Patient Active Problem List   Diagnosis Date Noted   Primary narcolepsy without cataplexy 04/18/2017   Chronic fatigue 04/18/2017   Severe depression (HCC) 04/18/2017   Tobacco use disorder, severe, dependence 04/18/2017   Chronic rhinitis 04/18/2017    Past Surgical History:  Procedure Laterality Date   TUBAL LIGATION      OB History   No obstetric history on file.      Home Medications    Prior to Admission medications   Medication Sig Start Date End Date Taking? Authorizing Provider  promethazine-dextromethorphan (PROMETHAZINE-DM) 6.25-15 MG/5ML syrup Take 5 mLs by mouth 3 (three) times daily as needed for cough. 07/13/21  Yes Bing Neighbors, FNP  cetirizine (ZYRTEC) 10 MG tablet Take 1 tablet (10 mg total) by mouth daily. 07/13/21   Bing Neighbors, FNP  fluticasone (FLONASE) 50 MCG/ACT nasal spray Place 2 sprays into both nostrils daily. 07/13/21   Bing Neighbors, FNP    Family History Family History  Problem Relation Age of Onset   Cancer Maternal Grandfather     Social History Social History   Tobacco Use   Smoking  status: Every Day    Packs/day: 1.00    Types: Cigarettes   Smokeless tobacco: Never  Vaping Use   Vaping Use: Never used  Substance Use Topics   Alcohol use: Yes    Comment: ocassional   Drug use: No     Allergies   Aspirin   Review of Systems Review of Systems Pertinent negatives listed in HPI  Physical Exam Triage Vital Signs ED Triage Vitals [07/13/21 1557]  Enc Vitals Group     BP      Pulse      Resp      Temp      Temp src      SpO2      Weight      Height      Head Circumference      Peak Flow      Pain Score 8     Pain Loc      Pain Edu?      Excl. in GC?    No data found.  Updated Vital Signs BP 111/75 (BP Location: Left Arm)   Pulse 80   Temp 98 F (36.7 C) (Oral)   Resp 18   SpO2 96%   Visual Acuity Right Eye Distance:   Left Eye Distance:   Bilateral Distance:    Right Eye Near:   Left Eye Near:    Bilateral  Near:     Physical Exam  General Appearance:    Alert, cooperative, no distress  HENT:   Normocephalic, ears normal, nares patient without congestion, rhinorrhea present, oropharynx w/ erythema or swelling   Eyes:    PERRL, conjunctiva/corneas clear, EOM's intact       Lungs:     Clear to auscultation bilaterally, respirations unlabored  Heart:    Regular rate and rhythm  Neurologic:   Awake, alert, oriented x 3. No apparent focal neurological           defect.      UC Treatments / Results  Labs (all labs ordered are listed, but only abnormal results are displayed) Labs Reviewed - No data to display  EKG   Radiology No results found.  Procedures Procedures (including critical care time)  Medications Ordered in UC Medications  acetaminophen (TYLENOL) tablet 650 mg (650 mg Oral Given 07/13/21 1623)    Initial Impression / Assessment and Plan / UC Course  I have reviewed the triage vital signs and the nursing notes.  Pertinent labs & imaging results that were available during my care of the patient were reviewed by  me and considered in my medical decision making (see chart for details).    Seasonal allergic rhinitis with acute cough Resume Zyrtec and adding Flonase daily. Promethazine DM as needed for cough Hydrate well with fluids. Follow-up with primary care provider return here as needed. Final Clinical Impressions(s) / UC Diagnoses   Final diagnoses:  Seasonal allergic rhinitis, unspecified trigger  Acute cough     Discharge Instructions      Take Tylenol as needed for headache pain. Resume daily allergy medications. Promethazine DM I have prescribed for cough.     ED Prescriptions     Medication Sig Dispense Auth. Provider   cetirizine (ZYRTEC) 10 MG tablet Take 1 tablet (10 mg total) by mouth daily. 30 tablet Bing Neighbors, FNP   fluticasone (FLONASE) 50 MCG/ACT nasal spray Place 2 sprays into both nostrils daily. 16 g Bing Neighbors, FNP   promethazine-dextromethorphan (PROMETHAZINE-DM) 6.25-15 MG/5ML syrup Take 5 mLs by mouth 3 (three) times daily as needed for cough. 118 mL Bing Neighbors, FNP      PDMP not reviewed this encounter.   Bing Neighbors, FNP 07/14/21 1758

## 2021-08-09 ENCOUNTER — Ambulatory Visit (HOSPITAL_COMMUNITY)
Admission: EM | Admit: 2021-08-09 | Discharge: 2021-08-09 | Disposition: A | Discharge status: Home / Self Care | Payer: Medicare Other | Attending: Family Medicine | Admitting: Family Medicine

## 2021-08-09 ENCOUNTER — Encounter (HOSPITAL_COMMUNITY): Payer: Self-pay

## 2021-08-09 DIAGNOSIS — N3090 Cystitis, unspecified without hematuria: Secondary | ICD-10-CM | POA: Insufficient documentation

## 2021-08-09 DIAGNOSIS — R1013 Epigastric pain: Secondary | ICD-10-CM | POA: Diagnosis present

## 2021-08-09 DIAGNOSIS — R110 Nausea: Secondary | ICD-10-CM | POA: Insufficient documentation

## 2021-08-09 LAB — CBC WITH DIFFERENTIAL/PLATELET
Abs Immature Granulocytes: 0.03 10*3/uL (ref 0.00–0.07)
Basophils Absolute: 0 10*3/uL (ref 0.0–0.1)
Basophils Relative: 1 %
Eosinophils Absolute: 0.2 10*3/uL (ref 0.0–0.5)
Eosinophils Relative: 2 %
HCT: 37.3 % (ref 36.0–46.0)
Hemoglobin: 12 g/dL (ref 12.0–15.0)
Immature Granulocytes: 1 %
Lymphocytes Relative: 44 %
Lymphs Abs: 2.9 10*3/uL (ref 0.7–4.0)
MCH: 26.1 pg (ref 26.0–34.0)
MCHC: 32.2 g/dL (ref 30.0–36.0)
MCV: 81.1 fL (ref 80.0–100.0)
Monocytes Absolute: 0.5 10*3/uL (ref 0.1–1.0)
Monocytes Relative: 8 %
Neutro Abs: 2.9 10*3/uL (ref 1.7–7.7)
Neutrophils Relative %: 44 %
Platelets: 183 10*3/uL (ref 150–400)
RBC: 4.6 MIL/uL (ref 3.87–5.11)
RDW: 14.6 % (ref 11.5–15.5)
WBC: 6.6 10*3/uL (ref 4.0–10.5)
nRBC: 0 % (ref 0.0–0.2)

## 2021-08-09 LAB — POCT URINALYSIS DIPSTICK, ED / UC
Bilirubin Urine: NEGATIVE
Glucose, UA: NEGATIVE mg/dL
Ketones, ur: NEGATIVE mg/dL
Nitrite: POSITIVE — AB
Protein, ur: NEGATIVE mg/dL
Specific Gravity, Urine: 1.02 (ref 1.005–1.030)
Urobilinogen, UA: 0.2 mg/dL (ref 0.0–1.0)
pH: 5.5 (ref 5.0–8.0)

## 2021-08-09 LAB — COMPREHENSIVE METABOLIC PANEL
ALT: 15 U/L (ref 0–44)
AST: 15 U/L (ref 15–41)
Albumin: 3.9 g/dL (ref 3.5–5.0)
Alkaline Phosphatase: 93 U/L (ref 38–126)
Anion gap: 6 (ref 5–15)
BUN: 5 mg/dL — ABNORMAL LOW (ref 6–20)
CO2: 26 mmol/L (ref 22–32)
Calcium: 9.2 mg/dL (ref 8.9–10.3)
Chloride: 104 mmol/L (ref 98–111)
Creatinine, Ser: 0.86 mg/dL (ref 0.44–1.00)
GFR, Estimated: >60 mL/min (ref >60)
Glucose, Bld: 98 mg/dL (ref 70–99)
Potassium: 4.3 mmol/L (ref 3.5–5.1)
Sodium: 136 mmol/L (ref 135–145)
Total Bilirubin: 0.5 mg/dL (ref 0.3–1.2)
Total Protein: 6.9 g/dL (ref 6.5–8.1)

## 2021-08-09 LAB — LIPASE, BLOOD: Lipase: 26 U/L (ref 11–51)

## 2021-08-09 LAB — POC URINE PREG, ED: Preg Test, Ur: NEGATIVE

## 2021-08-09 MED ORDER — ONDANSETRON 4 MG PO TBDP: 4.0000 mg | ORAL_TABLET | Freq: Three times a day (TID) | ORAL | 0 refills | Status: DC | PRN

## 2021-08-09 MED ORDER — PANTOPRAZOLE SODIUM 20 MG PO TBEC: 20.0000 mg | DELAYED_RELEASE_TABLET | Freq: Every day | ORAL | 0 refills | Status: DC

## 2021-08-09 MED ORDER — ONDANSETRON 4 MG PO TBDP
4.0000 mg | ORAL_TABLET | Freq: Once | ORAL | Status: AC
  Administered 2021-08-09: 4 mg via ORAL

## 2021-08-09 MED ORDER — ONDANSETRON 4 MG PO TBDP
ORAL_TABLET | ORAL | Status: AC
  Filled 2021-08-09: qty 1

## 2021-08-09 MED ORDER — CEPHALEXIN 500 MG PO CAPS: 500.0000 mg | ORAL_CAPSULE | Freq: Two times a day (BID) | ORAL | 0 refills | Status: DC

## 2021-08-10 NOTE — ED Provider Notes (Signed)
[facility]    ASSESSMENT & PLAN:  1. Epigastric pain   2. Cystitis   3. Nausea    No significant lab abnormalities noted.  Will tx for bladder infection. Likely GERD also. Begin trial of PPI.  Meds ordered this encounter  Medications   ondansetron (ZOFRAN-ODT) disintegrating tablet 4 mg   ondansetron (ZOFRAN-ODT) 4 MG disintegrating tablet    Sig: Take 1 tablet (4 mg total) by mouth every 8 (eight) hours as needed for nausea or vomiting.    Dispense:  15 tablet    Refill:  0   cephALEXin (KEFLEX) 500 MG capsule    Sig: Take 1 capsule (500 mg total) by mouth 2 (two) times daily.    Dispense:  10 capsule    Refill:  0   pantoprazole (PROTONIX) 20 MG tablet    Sig: Take 1 tablet (20 mg total) by mouth daily.    Dispense:  30 tablet    Refill:  0   No signs of pyelonephritis.  No signs of dehydration. Urine culture sent. Will notify patient when results available. Will follow up with her PCP or here if not showing improvement over the next 48 hours, sooner if needed.  Outlined signs and symptoms indicating need for more acute intervention. Patient verbalized understanding. After Visit Summary given.  SUBJECTIVE:  Sherry Leonard is a 51 y.o. female who complains of fatigue and nausea; onset this am; "just not feeling well". Afebrile. Tolerating PO fluids. On/off epigastric abd discomfort for sev weeks; mostly post-prandial. No specific urinary symptoms. No vaginal discharge.  LMP: Patient's last menstrual period was 08/09/2021 (exact date).   OBJECTIVE:  Vitals:   08/09/21 1235  BP: (!) 105/55  Pulse: 76  Resp: 16  Temp: 98.1 F (36.7 C)  TempSrc: Oral  SpO2: 100%   General appearance: alert; no distress HENT: oropharynx: moist Lungs: unlabored respirations Abdomen: soft, non-tender; bowel sounds normal; no masses or organomegaly; no guarding or rebound tenderness Back: no CVA tenderness Extremities: no edema; symmetrical with no gross  deformities Skin: warm and dry Neurologic: normal gait Psychological: alert and cooperative; normal mood and affect  Labs Reviewed  COMPREHENSIVE METABOLIC PANEL - Abnormal; Notable for the following components:      Result Value   BUN 5 (*)    All other components within normal limits  POCT URINALYSIS DIPSTICK, ED / UC - Abnormal; Notable for the following components:   Hgb urine dipstick LARGE (*)    Nitrite POSITIVE (*)    Leukocytes,Ua TRACE (*)    All other components within normal limits  CBC WITH DIFFERENTIAL/PLATELET  LIPASE, BLOOD  POC URINE PREG, ED    Allergies  Allergen Reactions   Aspirin     itch    Past Medical History:  Diagnosis Date   Allergy    Anxiety    Arthritis    Depression    Narcolepsy    Social History   Socioeconomic History   Marital status: Married    Spouse name: Not on file   Number of children: Not on file   Years of education: Not on file   Highest education level: Not on file  Occupational History   Not on file  Tobacco Use   Smoking status: Every Day    Packs/day: 1.00    Types: Cigarettes   Smokeless tobacco: Never  Vaping Use   Vaping Use: Never used  Substance and Sexual Activity   Alcohol use: Yes    Comment:  ocassional   Drug use: No   Sexual activity: Yes  Other Topics Concern   Not on file  Social History Narrative   Not on file   Social Determinants of Health   Financial Resource Strain: Not on file  Food Insecurity: Not on file  Transportation Needs: Not on file  Physical Activity: Not on file  Stress: Not on file  Social Connections: Not on file  Intimate Partner Violence: Not on file   Family History  Problem Relation Age of Onset   Cancer Maternal Glynda Jaeger, MD 08/10/21 [phone]

## 2021-08-24 ENCOUNTER — Ambulatory Visit (HOSPITAL_COMMUNITY)
Admission: EM | Admit: 2021-08-24 | Discharge: 2021-08-24 | Disposition: A | Discharge status: Home / Self Care | Payer: Medicare Other | Attending: Emergency Medicine | Admitting: Emergency Medicine

## 2021-08-24 ENCOUNTER — Encounter (HOSPITAL_COMMUNITY): Payer: Self-pay

## 2021-08-24 DIAGNOSIS — R1013 Epigastric pain: Secondary | ICD-10-CM

## 2021-08-24 DIAGNOSIS — R110 Nausea: Secondary | ICD-10-CM | POA: Diagnosis not present

## 2021-08-24 MED ORDER — PANTOPRAZOLE SODIUM 20 MG PO TBEC: 20.0000 mg | DELAYED_RELEASE_TABLET | Freq: Every day | ORAL | 0 refills | Status: DC

## 2021-08-24 MED ORDER — ONDANSETRON HCL 4 MG PO TABS: 4.0000 mg | ORAL_TABLET | Freq: Three times a day (TID) | ORAL | 0 refills | Status: DC | PRN

## 2021-08-24 MED ORDER — ONDANSETRON 4 MG PO TBDP
ORAL_TABLET | ORAL | Status: AC
  Filled 2021-08-24: qty 1

## 2021-08-24 MED ORDER — ONDANSETRON 4 MG PO TBDP
4.0000 mg | ORAL_TABLET | Freq: Once | ORAL | Status: AC
Start: 2021-08-24 — End: 2021-08-24
  Administered 2021-08-24: 4 mg via ORAL

## 2021-08-24 MED ORDER — ALUM & MAG HYDROXIDE-SIMETH 200-200-20 MG/5ML PO SUSP
ORAL | Status: AC
  Filled 2021-08-24: qty 30

## 2021-08-24 MED ORDER — ALUM & MAG HYDROXIDE-SIMETH 200-200-20 MG/5ML PO SUSP
30.0000 mL | Freq: Once | ORAL | Status: AC
  Administered 2021-08-24: 30 mL via ORAL

## 2021-08-24 NOTE — ED Provider Notes (Signed)
[facility]    CSN: [id number] Arrival date & time: 08/24/21  1319      History   Chief Complaint Chief Complaint  Patient presents with   Abdominal Pain    HPI Sherry Leonard is a 51 y.o. female. Pt is a challenging historian. It appears that pt has had irregular patterns of vaginal bleeding for over a year. Does not have any bleeding right now, last was 2 weeks ago.  About a year ago, her menstrual cycles became irregular.  Pt also complains of epigastric area abd pain for an unknown period of time. It might have been occurring intermittently for as long as a year or as little as a month.  It is difficult to get a timeline of symptoms. She has the pain today associated with nausea but no vomiting. Was seen here in urgent care last month for same symptoms, was prescribed pantoprazole and ondanestron for symptoms but pt did not obtain these prescriptions or take them; she reports that the pharmacy said they did not have these prescriptions for her.  Denies fever or chills.  Reports abdominal pain that she is experiencing right now feels like a fullness, or pressure, or feels like something is protruding.  When she lies down, she feels like area that is protruding receipts.  Patient reports feeling bloated.  Patient is also in need of assistance finding a new PCP.   Abdominal Pain Associated symptoms: nausea and vaginal bleeding   Associated symptoms: no chills, no constipation, no diarrhea, no dysuria, no fever and no vomiting     Past Medical History:  Diagnosis Date   Allergy    Anxiety    Arthritis    Depression    Narcolepsy     Patient Active Problem List   Diagnosis Date Noted   Primary narcolepsy without cataplexy 04/18/2017   Chronic fatigue 04/18/2017   Severe depression (HCC) 04/18/2017   Tobacco use disorder, severe, dependence 04/18/2017   Chronic rhinitis 04/18/2017    Past Surgical History:  Procedure Laterality Date   TUBAL LIGATION      OB  History   No obstetric history on file.      Home Medications    Prior to Admission medications   Medication Sig Start Date End Date Taking? Authorizing Provider  ondansetron (ZOFRAN) 4 MG tablet Take 1 tablet (4 mg total) by mouth every 8 (eight) hours as needed for nausea or vomiting. 08/24/21  Yes Phelan Goers, Marzella Schlein, NP  cephALEXin (KEFLEX) 500 MG capsule Take 1 capsule (500 mg total) by mouth 2 (two) times daily. Patient not taking: Reported on 08/24/2021 08/09/21   Mardella Layman, MD  cetirizine (ZYRTEC) 10 MG tablet Take 1 tablet (10 mg total) by mouth daily. 07/13/21   Bing Neighbors, FNP  fluticasone (FLONASE) 50 MCG/ACT nasal spray Place 2 sprays into both nostrils daily. Patient not taking: Reported on 08/24/2021 07/13/21   Bing Neighbors, FNP  pantoprazole (PROTONIX) 20 MG tablet Take 1 tablet (20 mg total) by mouth daily. 08/24/21   Cathlyn Parsons, NP  promethazine-dextromethorphan (PROMETHAZINE-DM) 6.25-15 MG/5ML syrup Take 5 mLs by mouth 3 (three) times daily as needed for cough. Patient not taking: Reported on 08/24/2021 07/13/21   Bing Neighbors, FNP    Family History Family History  Problem Relation Age of Onset   Cancer Maternal Grandfather     Social History Social History   Tobacco Use   Smoking status: Every Day    Packs/day: 1.00  Types: Cigarettes   Smokeless tobacco: Never  Vaping Use   Vaping Use: Never used  Substance Use Topics   Alcohol use: Yes    Comment: ocassional   Drug use: No     Allergies   Aspirin   Review of Systems Review of Systems  Constitutional:  Negative for chills and fever.  Gastrointestinal:  Positive for abdominal distention, abdominal pain and nausea. Negative for blood in stool, constipation, diarrhea and vomiting.  Genitourinary:  Positive for vaginal bleeding. Negative for dysuria and flank pain.     Physical Exam Triage Vital Signs ED Triage Vitals  Enc Vitals Group     BP 08/24/21 1349 114/81      Pulse Rate 08/24/21 1349 73     Resp 08/24/21 1349 14     Temp 08/24/21 1349 98.2 F (36.8 C)     Temp Source 08/24/21 1349 Oral     SpO2 08/24/21 1349 99 %     Weight --      Height --      Head Circumference --      Peak Flow --      Pain Score 08/24/21 1347 10     Pain Loc --      Pain Edu? --      Excl. in GC? --    No data found.  Updated Vital Signs BP 114/81 (BP Location: Right Arm)   Pulse 73   Temp 98.2 F (36.8 C) (Oral)   Resp 14   LMP 08/09/2021 (Exact Date)   SpO2 99%   Visual Acuity Right Eye Distance:   Left Eye Distance:   Bilateral Distance:    Right Eye Near:   Left Eye Near:    Bilateral Near:     Physical Exam Constitutional:      Appearance: She is well-developed.     Comments: Appears uncomfortable, rocking from pain  Cardiovascular:     Rate and Rhythm: Normal rate and regular rhythm.  Pulmonary:     Effort: Pulmonary effort is normal.     Breath sounds: Normal breath sounds.  Abdominal:     General: Abdomen is flat. Bowel sounds are normal.     Palpations: There is no mass.     Tenderness: There is abdominal tenderness in the epigastric area. There is no right CVA tenderness, left CVA tenderness, guarding or rebound.  Neurological:     Mental Status: She is alert.      UC Treatments / Results  Labs (all labs ordered are listed, but only abnormal results are displayed) Labs Reviewed - No data to display  EKG   Radiology No results found.  Procedures Procedures (including critical care time)  Medications Ordered in UC Medications  alum & mag hydroxide-simeth (MAALOX/MYLANTA) 200-200-20 MG/5ML suspension 30 mL (30 mLs Oral Given 08/24/21 1455)  ondansetron (ZOFRAN-ODT) disintegrating tablet 4 mg (4 mg Oral Given 08/24/21 1456)    Initial Impression / Assessment and Plan / UC Course  I have reviewed the triage vital signs and the nursing notes.  Pertinent labs & imaging results that were available during my care of the  patient were reviewed by me and considered in my medical decision making (see chart for details).    Helped patient make a new PCP appointment.  Will defer management of irregular vaginal bleeding to PCP.  It is possible she is perimenopausal.  Patient given GI cocktail consisting of Maalox and ODT Zofran here in urgent care.  Patient substantially improved.  Abdominal pain/fullness feeling almost completely resolved.  Nausea completely resolved.  Discussed with patient that there are number of things that can be causing her symptoms, including a hiatal hernia and GERD encouraged her to follow-up with GI.  Prescribed ondansetron and Protonix again.  Discussed use of these medications.  Final Clinical Impressions(s) / UC Diagnoses   Final diagnoses:  Epigastric pain  Nausea without vomiting     Discharge Instructions      You have a new patient appointment with Rodman Pickle, NP on Thursday July 20th at 10am. They want you to arrive at 9:45am to fill out papers. Bring your insurance cards.   [facility]:  [address] [place], [place], [place] [address]  Also get an appointment with someone at [facility] for your stomach pain   Use Maalox or Mylanta according to bottle directions if your pain returns. Use the nausea medicine (ondansetron) if your nausea returns.  Take the pantoprozole every day even if you are feeling ok.       ED Prescriptions     Medication Sig Dispense Auth. Provider   pantoprazole (PROTONIX) 20 MG tablet Take 1 tablet (20 mg total) by mouth daily. 30 tablet Cathlyn Parsons, NP   ondansetron (ZOFRAN) 4 MG tablet Take 1 tablet (4 mg total) by mouth every 8 (eight) hours as needed for nausea or vomiting. 20 tablet Cathlyn Parsons, NP      PDMP not reviewed this encounter.   Cathlyn Parsons, NP 08/24/21 1610

## 2021-08-24 NOTE — ED Triage Notes (Signed)
Pt presents with c/o continued abdominal pain x 1 month. Pt endorses nausea today. Pt states since pain began she has had an MRI and PAP smear.

## 2021-08-24 NOTE — Discharge Instructions (Addendum)
You have a new patient appointment with Sherry Pickle, NP on Thursday July 20th at 10am. They want you to arrive at 9:45am to fill out papers. Bring your insurance cards.   [facility]:  [address] [place], [place], [place] [address]  Also get an appointment with someone at [facility] for your stomach pain   Use Maalox or Mylanta according to bottle directions if your pain returns. Use the nausea medicine (ondansetron) if your nausea returns.  Take the pantoprozole every day even if you are feeling ok.

## 2021-09-01 ENCOUNTER — Telehealth: Payer: Self-pay

## 2021-09-01 ENCOUNTER — Ambulatory Visit: Payer: Medicare Other | Admitting: Nurse Practitioner

## 2021-09-01 NOTE — Telephone Encounter (Signed)
NS letter sent.

## 2021-09-16 NOTE — Telephone Encounter (Signed)
Not a no show, cancelled appt as we do not accept [facility]

## 2021-10-25 ENCOUNTER — Encounter (HOSPITAL_COMMUNITY): Payer: Self-pay

## 2021-10-25 ENCOUNTER — Ambulatory Visit (HOSPITAL_COMMUNITY)
Admission: EM | Admit: 2021-10-25 | Discharge: 2021-10-25 | Disposition: A | Discharge status: Home / Self Care | Payer: Medicare Other | Attending: Family Medicine | Admitting: Family Medicine

## 2021-10-25 DIAGNOSIS — L03211 Cellulitis of face: Secondary | ICD-10-CM

## 2021-10-25 MED ORDER — TRAMADOL HCL 50 MG PO TABS: 50.0000 mg | ORAL_TABLET | Freq: Four times a day (QID) | ORAL | 0 refills | Status: DC | PRN

## 2021-10-25 MED ORDER — AMOXICILLIN-POT CLAVULANATE 875-125 MG PO TABS: 1.0000 | ORAL_TABLET | Freq: Two times a day (BID) | ORAL | 0 refills | Status: AC

## 2021-10-25 NOTE — Discharge Instructions (Addendum)
Take amoxicillin-clavulanate 875 mg--1 tab twice daily with food for 7 days  Take tramadol 50 mg-- 1 tablet every 6 hours as needed for pain.  This medication can make you sleepy or dizzy   

## 2021-10-25 NOTE — ED Provider Notes (Signed)
[facility]    CSN: [id number] Arrival date & time: 10/25/21  1543      History   Chief Complaint Chief Complaint  Patient presents with   Facial Swelling    Nostrils     HPI Sherry Leonard is a 51 y.o. female.   HPI Here with pain and swelling in her upper lip and at the base of her nose.  Symptoms began this morning.  At first she felt like it made it hard to breathe through her nose, but that has actually improved.  Also is hurting to chew.  No fever or chills.  She has maybe had a little cough this afternoon, but no sore throat  Past Medical History:  Diagnosis Date   Allergy    Anxiety    Arthritis    Depression    Narcolepsy     Patient Active Problem List   Diagnosis Date Noted   Primary narcolepsy without cataplexy 04/18/2017   Chronic fatigue 04/18/2017   Severe depression (HCC) 04/18/2017   Tobacco use disorder, severe, dependence 04/18/2017   Chronic rhinitis 04/18/2017    Past Surgical History:  Procedure Laterality Date   TUBAL LIGATION      OB History   No obstetric history on file.      Home Medications    Prior to Admission medications   Medication Sig Start Date End Date Taking? Authorizing Provider  amoxicillin-clavulanate (AUGMENTIN) 875-125 MG tablet Take 1 tablet by mouth 2 (two) times daily for 7 days. 10/25/21 11/01/21 Yes Severina Sykora, Janace Aris, MD  traMADol (ULTRAM) 50 MG tablet Take 1 tablet (50 mg total) by mouth every 6 (six) hours as needed (pain). 10/25/21  Yes Zenia Resides, MD  cetirizine (ZYRTEC) 10 MG tablet Take 1 tablet (10 mg total) by mouth daily. 07/13/21   Bing Neighbors, FNP  pantoprazole (PROTONIX) 20 MG tablet Take 1 tablet (20 mg total) by mouth daily. 08/24/21   Cathlyn Parsons, NP    Family History Family History  Problem Relation Age of Onset   Cancer Maternal Grandfather     Social History Social History   Tobacco Use   Smoking status: Every Day    Packs/day: 1.00    Types: Cigarettes    Smokeless tobacco: Never  Vaping Use   Vaping Use: Never used  Substance Use Topics   Alcohol use: Yes    Comment: ocassional   Drug use: No     Allergies   Aspirin   Review of Systems Review of Systems   Physical Exam Triage Vital Signs ED Triage Vitals  Enc Vitals Group     BP 10/25/21 1726 107/64     Pulse Rate 10/25/21 1726 74     Resp 10/25/21 1726 16     Temp 10/25/21 1726 98.9 F (37.2 C)     Temp Source 10/25/21 1726 Oral     SpO2 10/25/21 1726 100 %     Weight --      Height --      Head Circumference --      Peak Flow --      Pain Score 10/25/21 1727 4     Pain Loc --      Pain Edu? --      Excl. in GC? --    No data found.  Updated Vital Signs BP 107/64 (BP Location: Right Arm)   Pulse 74   Temp 98.9 F (37.2 C) (Oral)   Resp 16  SpO2 100%   Visual Acuity Right Eye Distance:   Left Eye Distance:   Bilateral Distance:    Right Eye Near:   Left Eye Near:    Bilateral Near:     Physical Exam Vitals reviewed.  Constitutional:      General: She is not in acute distress.    Appearance: She is not ill-appearing, toxic-appearing or diaphoretic.  HENT:     Mouth/Throat:     Mouth: Mucous membranes are moist.     Pharynx: No oropharyngeal exudate or posterior oropharyngeal erythema.     Comments: There is tenderness and mild induration of the upper lip.  It is also a little swollen between her vermilion border and her nostrils.  There is no fluctuance.  Also there is no definite swelling noted of the nares or the mucosa at this point.  There is some dental caries noted of the upper central incisors.  No definite abscess noted inside the mouth Eyes:     Extraocular Movements: Extraocular movements intact.     Conjunctiva/sclera: Conjunctivae normal.     Pupils: Pupils are equal, round, and reactive to light.  Cardiovascular:     Rate and Rhythm: Normal rate and regular rhythm.     Heart sounds: No murmur heard. Pulmonary:     Effort:  Pulmonary effort is normal. No respiratory distress.     Breath sounds: No stridor. No wheezing, rhonchi or rales.  Skin:    Coloration: Skin is not jaundiced or pale.  Neurological:     General: No focal deficit present.     Mental Status: She is alert and oriented to person, place, and time.      UC Treatments / Results  Labs (all labs ordered are listed, but only abnormal results are displayed) Labs Reviewed - No data to display  EKG   Radiology No results found.  Procedures Procedures (including critical care time)  Medications Ordered in UC Medications - No data to display  Initial Impression / Assessment and Plan / UC Course  I have reviewed the triage vital signs and the nursing notes.  Pertinent labs & imaging results that were available during my care of the patient were reviewed by me and considered in my medical decision making (see chart for details).        I will treat with Augmentin for possible oral infection.  She is given a list of low-cost dental providers.  She is allergic to aspirin which causes itching.  So I am going to send in tramadol for her pain relief.  She has not had anything on PMP in over a year Final Clinical Impressions(s) / UC Diagnoses   Final diagnoses:  Facial cellulitis     Discharge Instructions      Take amoxicillin-clavulanate 875 mg--1 tab twice daily with food for 7 days  Take tramadol 50 mg-- 1 tablet every 6 hours as needed for pain.  This medication can make you sleepy or dizzy       ED Prescriptions     Medication Sig Dispense Auth. Provider   amoxicillin-clavulanate (AUGMENTIN) 875-125 MG tablet Take 1 tablet by mouth 2 (two) times daily for 7 days. 14 tablet Abbigale Mcelhaney, Janace Aris, MD   traMADol (ULTRAM) 50 MG tablet Take 1 tablet (50 mg total) by mouth every 6 (six) hours as needed (pain). 12 tablet Chakia Counts, Janace Aris, MD      I have reviewed the PDMP during this encounter.   Zenia Resides,  MD 10/25/21 1741

## 2021-10-25 NOTE — ED Triage Notes (Signed)
Internal nostril swelling and pain on both sides. Pressure and pain going down to the upper lip.   Onset this morning. Patient thinks one of her upper teeth may be bad. No discharge or bleeding

## 2021-11-22 ENCOUNTER — Other Ambulatory Visit: Payer: Self-pay

## 2021-11-22 ENCOUNTER — Encounter (HOSPITAL_COMMUNITY): Payer: Self-pay | Admitting: *Deleted

## 2021-11-22 ENCOUNTER — Ambulatory Visit (HOSPITAL_COMMUNITY)
Admission: EM | Admit: 2021-11-22 | Discharge: 2021-11-22 | Disposition: A | Discharge status: Home / Self Care | Payer: Medicare Other | Attending: Internal Medicine | Admitting: Internal Medicine

## 2021-11-22 DIAGNOSIS — J069 Acute upper respiratory infection, unspecified: Secondary | ICD-10-CM | POA: Insufficient documentation

## 2021-11-22 DIAGNOSIS — R051 Acute cough: Secondary | ICD-10-CM | POA: Diagnosis not present

## 2021-11-22 DIAGNOSIS — Z20822 Contact with and (suspected) exposure to covid-19: Secondary | ICD-10-CM | POA: Insufficient documentation

## 2021-11-22 MED ORDER — ALBUTEROL SULFATE (2.5 MG/3ML) 0.083% IN NEBU
INHALATION_SOLUTION | RESPIRATORY_TRACT | Status: AC
  Filled 2021-11-22: qty 3

## 2021-11-22 MED ORDER — METHYLPREDNISOLONE SODIUM SUCC 125 MG IJ SOLR
INTRAMUSCULAR | Status: AC
  Filled 2021-11-22: qty 2

## 2021-11-22 MED ORDER — METHYLPREDNISOLONE SODIUM SUCC 125 MG IJ SOLR
80.0000 mg | Freq: Once | INTRAMUSCULAR | Status: AC
  Administered 2021-11-22: 80 mg via INTRAMUSCULAR

## 2021-11-22 MED ORDER — ALBUTEROL SULFATE HFA 108 (90 BASE) MCG/ACT IN AERS
2.0000 | INHALATION_SPRAY | Freq: Once | RESPIRATORY_TRACT | Status: AC
  Administered 2021-11-22: 2 via RESPIRATORY_TRACT

## 2021-11-22 MED ORDER — ACETAMINOPHEN 325 MG PO TABS
ORAL_TABLET | ORAL | Status: AC
  Filled 2021-11-22: qty 3

## 2021-11-22 MED ORDER — GUAIFENESIN ER 1200 MG PO TB12: 1200.0000 mg | ORAL_TABLET | Freq: Two times a day (BID) | ORAL | 0 refills | Status: DC

## 2021-11-22 MED ORDER — IPRATROPIUM-ALBUTEROL 0.5-2.5 (3) MG/3ML IN SOLN
RESPIRATORY_TRACT | Status: AC
  Filled 2021-11-22: qty 3

## 2021-11-22 MED ORDER — ALBUTEROL SULFATE HFA 108 (90 BASE) MCG/ACT IN AERS: 1.0000 | INHALATION_SPRAY | Freq: Four times a day (QID) | RESPIRATORY_TRACT | 0 refills | Status: DC | PRN

## 2021-11-22 MED ORDER — BENZONATATE 100 MG PO CAPS: 100.0000 mg | ORAL_CAPSULE | Freq: Three times a day (TID) | ORAL | 0 refills | Status: DC

## 2021-11-22 MED ORDER — ALBUTEROL SULFATE HFA 108 (90 BASE) MCG/ACT IN AERS
INHALATION_SPRAY | RESPIRATORY_TRACT | Status: AC
  Filled 2021-11-22: qty 6.7

## 2021-11-22 MED ORDER — ACETAMINOPHEN 325 MG PO TABS
975.0000 mg | ORAL_TABLET | Freq: Once | ORAL | Status: AC
  Administered 2021-11-22: 975 mg via ORAL

## 2021-11-22 NOTE — Discharge Instructions (Addendum)
You have a viral upper respiratory infection.  COVID-19 testing is pending. We will call you with results if positive. If your COVID test is positive, you must stay at home until day 6 of symptoms. On day 6, you may go out into public and go back to work, but you must wear a mask until day 11 of symptoms to prevent spread to others.  We gave you a steroid injection today in clinic to help with your breathing and the wheezing that was heard in your lungs.  You may use your albuterol inhaler every 4-6 hours 1 to 2 puffs as needed for cough, shortness of breath, and wheezing.  Rinse out your mouth after using this inhaler.  Take guaifenesin 1200mg   every 12 hours to thin your mucous so that you can get it out of your body easier with coughing/blowing your nose. Drink plenty of water while taking this medication so that it works well in your body (at least 8 cups a day).   Take tessalon pearles every 8 hours as needed for cough.  You may take tylenol 1,000mg  and ibuprofen 600mg  every 6 hours with food as needed for fever/chills, sore throat, aches/pains, and inflammation associated with viral illness. Take this with food to avoid stomach upset.    You may do salt water and baking soda gargles every 4 hours as needed for your throat pain.  Please put 1 teaspoon of salt and 1/2 teaspoon of baking soda in 8 ounces of warm water then gargle and spit the water out. You may also put 1 tablespoon of honey in warm water and drink this to soothe your throat.  Place a humidifier in your room at night to help decrease dry air that can irritate your airway and cause you to have a sore throat and cough.  Please try to eat a well-balanced diet while you are sick so that your body gets proper nutrition to heal.  If you develop any new or worsening symptoms, please return.  If your symptoms are severe, please go to the emergency room.  Follow-up with your primary care provider for further evaluation and management of  your symptoms as well as ongoing wellness visits.  I hope you feel better!

## 2021-11-22 NOTE — ED Provider Notes (Signed)
[place]    CSN: [id number] Arrival date & time: 11/22/21  1424      History   Chief Complaint Chief Complaint  Patient presents with   Headache   Cough   Nasal Congestion   Generalized Body Aches   Chills    HPI Sherry Leonard is a 51 y.o. female.   Patient presents to urgent care for evaluation of headache, body aches, chills, stuffy nose, and productive cough that started 5 days ago on Thursday November 17, 2021.  Cough is productive with yellow/clear sputum and worse at nighttime. Nasal congestion is thick and yellow/clear.  Sore throat is worsened with swallowing. Headache is generalized and is currently a 10 on a scale of 0-10.  Denies vision changes and dizziness.  Reports chills but unknown highest temp at home. Denies chest pain, nausea, vomiting, abdominal pain, diarrhea, and eye drainage. She becomes mildly short of breath when wheezing after coughing but states this gets better when she rests. Grandsons have been sick with similar symptoms but have fully recovered since symptom onset. Denies history of asthma or chronic respiratory problems.  Patient smokes cigarettes and denies other drug use. They are not vaccinated against COVID-19 and they have not received their seasonal flu vaccine this year.  Has attempted use of herbal tea prior to arrival at urgent care for relief of symptoms with some relief.    Headache Associated symptoms: cough   Cough Associated symptoms: headaches     Past Medical History:  Diagnosis Date   Allergy    Anxiety    Arthritis    Depression    Narcolepsy     Patient Active Problem List   Diagnosis Date Noted   Primary narcolepsy without cataplexy 04/18/2017   Chronic fatigue 04/18/2017   Severe depression ([place]) 04/18/2017   Tobacco use disorder, severe, dependence 04/18/2017   Chronic rhinitis 04/18/2017    Past Surgical History:  Procedure Laterality Date   TUBAL LIGATION      OB History   No obstetric history on  file.      Home Medications    Prior to Admission medications   Medication Sig Start Date End Date Taking? Authorizing Provider  albuterol (VENTOLIN HFA) 108 (90 Base) MCG/ACT inhaler Inhale 1-2 puffs into the lungs every 6 (six) hours as needed for wheezing or shortness of breath. 11/22/21  Yes Talbot Grumbling, FNP  benzonatate (TESSALON) 100 MG capsule Take 1 capsule (100 mg total) by mouth every 8 (eight) hours. 11/22/21  Yes Talbot Grumbling, FNP  Guaifenesin 1200 MG TB12 Take 1 tablet (1,200 mg total) by mouth in the morning and at bedtime. 11/22/21  Yes Talbot Grumbling, FNP  cetirizine (ZYRTEC) 10 MG tablet Take 1 tablet (10 mg total) by mouth daily. 07/13/21   Scot Jun, FNP  pantoprazole (PROTONIX) 20 MG tablet Take 1 tablet (20 mg total) by mouth daily. 08/24/21   Carvel Getting, NP  traMADol (ULTRAM) 50 MG tablet Take 1 tablet (50 mg total) by mouth every 6 (six) hours as needed (pain). 10/25/21   Barrett Henle, MD    Family History Family History  Problem Relation Age of Onset   Cancer Maternal Grandfather     Social History Social History   Tobacco Use   Smoking status: Every Day    Packs/day: 1.00    Types: Cigarettes   Smokeless tobacco: Never  Vaping Use   Vaping Use: Never used  Substance Use Topics  Alcohol use: Yes    Comment: ocassional   Drug use: No     Allergies   Aspirin   Review of Systems Review of Systems  Respiratory:  Positive for cough.   Neurological:  Positive for headaches.  Per HPI  Physical Exam Triage Vital Signs ED Triage Vitals [11/22/21 1517]  Enc Vitals Group     BP 118/87     Pulse Rate (!) 107     Resp 20     Temp 98.7 F (37.1 C)     Temp src      SpO2 97 %     Weight      Height      Head Circumference      Peak Flow      Pain Score 8     Pain Loc      Pain Edu?      Excl. in GC?    No data found.  Updated Vital Signs BP 118/87   Pulse (!) 107   Temp 98.7 F (37.1 C)    Resp 20   LMP  (LMP Unknown)   SpO2 97%   Visual Acuity Right Eye Distance:   Left Eye Distance:   Bilateral Distance:    Right Eye Near:   Left Eye Near:    Bilateral Near:     Physical Exam Vitals and nursing note reviewed.  Constitutional:      Appearance: She is ill-appearing. She is not toxic-appearing.  HENT:     Head: Normocephalic and atraumatic.     Right Ear: Hearing, tympanic membrane, ear canal and external ear normal.     Left Ear: Hearing, tympanic membrane, ear canal and external ear normal.     Nose: Congestion present.     Mouth/Throat:     Lips: Pink.     Mouth: Mucous membranes are moist.     Pharynx: Posterior oropharyngeal erythema present.     Comments: Small amount of clear postnasal drainage visualized to the posterior oropharynx.  Eyes:     General: Lids are normal. Vision grossly intact. Gaze aligned appropriately.        Right eye: No discharge.        Left eye: No discharge.     Extraocular Movements: Extraocular movements intact.     Conjunctiva/sclera: Conjunctivae normal.     Pupils: Pupils are equal, round, and reactive to light.  Cardiovascular:     Rate and Rhythm: Normal rate and regular rhythm.     Heart sounds: Normal heart sounds, S1 normal and S2 normal.  Pulmonary:     Effort: Pulmonary effort is normal. No respiratory distress.     Breath sounds: Normal air entry. Wheezing present.     Comments: Faint expiratory wheeze heard to the bilateral lower lung fields with auscultation. No acute respiratory distress. Cough elicited with deep inspiration during lung exam. Abdominal:     General: Bowel sounds are normal.     Palpations: Abdomen is soft.     Tenderness: There is no abdominal tenderness. There is no right CVA tenderness, left CVA tenderness or guarding.  Musculoskeletal:     Cervical back: Neck supple.  Lymphadenopathy:     Cervical: Cervical adenopathy present.  Skin:    General: Skin is warm and dry.     Capillary  Refill: Capillary refill takes less than 2 seconds.     Findings: No rash.  Neurological:     General: No focal deficit present.  Mental Status: She is alert and oriented to person, place, and time. Mental status is at baseline.     Cranial Nerves: No dysarthria or facial asymmetry.     Motor: No weakness.     Gait: Gait normal.     Comments: 5/5 strength throughout. No focal deficit to neurologic exam.  Psychiatric:        Mood and Affect: Mood normal.        Speech: Speech normal.        Behavior: Behavior normal.        Thought Content: Thought content normal.        Judgment: Judgment normal.      UC Treatments / Results  Labs (all labs ordered are listed, but only abnormal results are displayed) Labs Reviewed  SARS CORONAVIRUS 2 (TAT 6-24 HRS)    EKG   Radiology No results found.  Procedures Procedures (including critical care time)  Medications Ordered in UC Medications  albuterol (VENTOLIN HFA) 108 (90 Base) MCG/ACT inhaler 2 puff (2 puffs Inhalation Given 11/22/21 1616)  methylPREDNISolone sodium succinate (SOLU-MEDROL) 125 mg/2 mL injection 80 mg (80 mg Intramuscular Given 11/22/21 1608)  acetaminophen (TYLENOL) tablet 975 mg (975 mg Oral Given 11/22/21 1607)    Initial Impression / Assessment and Plan / UC Course  I have reviewed the triage vital signs and the nursing notes.  Pertinent labs & imaging results that were available during my care of the patient were reviewed by me and considered in my medical decision making (see chart for details).  Viral URI with cough Symptoms and physical exam consistent with a viral upper respiratory tract infection that will likely resolve with rest, fluids, and prescriptions for symptomatic relief. No indication for imaging today based on stable cardiopulmonary exam and hemodynamically stable vital signs. COVID-19 testing is pending.  We will call patient if this is positive.  Quarantine guidelines discussed. Currently  on day 6 of symptoms and therefore does not qualify for antiviral therapy.  Patient given albuterol inhaler and 80mg  solumedrol IM injection in clinic today for wheezing heard to auscultation.  Guaifenesin, tessalon pearles, and albuterol inhaler sent to pharmacy for symptomatic relief to be taken as prescribed.   May use ibuprofen/tylenol over the counter for body aches, fever/chills, and overall discomfort associated with viral illness. Nonpharmacologic interventions for symptom relief provided and after visit summary below.   Strict ED/urgent care return precautions given.  Patient verbalizes understanding and agreement with plan.  Counseled patient regarding possible side effects and uses of all medications prescribed at today's visit.  Patient verbalizes understanding and agreement with plan.  All questions answered.  Patient discharged from urgent care in stable condition.         Final Clinical Impressions(s) / UC Diagnoses   Final diagnoses:  Viral URI with cough  Acute cough     Discharge Instructions      You have a viral upper respiratory infection.  COVID-19 testing is pending. We will call you with results if positive. If your COVID test is positive, you must stay at home until day 6 of symptoms. On day 6, you may go out into public and go back to work, but you must wear a mask until day 11 of symptoms to prevent spread to others.  We gave you a steroid injection today in clinic to help with your breathing and the wheezing that was heard in your lungs.  You may use your albuterol inhaler every 4-6 hours 1 to  2 puffs as needed for cough, shortness of breath, and wheezing.  Rinse out your mouth after using this inhaler.  Take guaifenesin 1200mg   every 12 hours to thin your mucous so that you can get it out of your body easier with coughing/blowing your nose. Drink plenty of water while taking this medication so that it works well in your body (at least 8 cups a day).    Take tessalon pearles every 8 hours as needed for cough.  You may take tylenol 1,000mg  and ibuprofen 600mg  every 6 hours with food as needed for fever/chills, sore throat, aches/pains, and inflammation associated with viral illness. Take this with food to avoid stomach upset.    You may do salt water and baking soda gargles every 4 hours as needed for your throat pain.  Please put 1 teaspoon of salt and 1/2 teaspoon of baking soda in 8 ounces of warm water then gargle and spit the water out. You may also put 1 tablespoon of honey in warm water and drink this to soothe your throat.  Place a humidifier in your room at night to help decrease dry air that can irritate your airway and cause you to have a sore throat and cough.  Please try to eat a well-balanced diet while you are sick so that your body gets proper nutrition to heal.  If you develop any new or worsening symptoms, please return.  If your symptoms are severe, please go to the emergency room.  Follow-up with your primary care provider for further evaluation and management of your symptoms as well as ongoing wellness visits.  I hope you feel better!      ED Prescriptions     Medication Sig Dispense Auth. Provider   Guaifenesin 1200 MG TB12 Take 1 tablet (1,200 mg total) by mouth in the morning and at bedtime. 14 tablet M, FNP   benzonatate (TESSALON) 100 MG capsule Take 1 capsule (100 mg total) by mouth every 8 (eight) hours. 21 capsule Reita May M, FNP   albuterol (VENTOLIN HFA) 108 (90 Base) MCG/ACT inhaler Inhale 1-2 puffs into the lungs every 6 (six) hours as needed for wheezing or shortness of breath. 8 g Reita May, FNP      PDMP not reviewed this encounter.   M, Carlisle Beers 11/22/21 [phone]

## 2021-11-22 NOTE — ED Triage Notes (Signed)
Pt reports sx's started on Thursday. Pt has a HA,congestion,general body aches,chills and cough.

## 2021-11-23 LAB — SARS CORONAVIRUS 2 (TAT 6-24 HRS): SARS Coronavirus 2: NEGATIVE

## 2021-12-06 ENCOUNTER — Other Ambulatory Visit: Payer: Self-pay

## 2021-12-06 ENCOUNTER — Encounter (HOSPITAL_COMMUNITY): Payer: Self-pay | Admitting: *Deleted

## 2021-12-06 ENCOUNTER — Ambulatory Visit (HOSPITAL_COMMUNITY)
Admission: EM | Admit: 2021-12-06 | Discharge: 2021-12-06 | Disposition: A | Discharge status: Home / Self Care | Payer: Medicare Other | Attending: Internal Medicine | Admitting: Internal Medicine

## 2021-12-06 DIAGNOSIS — R300 Dysuria: Secondary | ICD-10-CM | POA: Insufficient documentation

## 2021-12-06 LAB — POCT URINALYSIS DIPSTICK, ED / UC
Bilirubin Urine: NEGATIVE
Glucose, UA: NEGATIVE mg/dL
Hgb urine dipstick: NEGATIVE
Ketones, ur: NEGATIVE mg/dL
Leukocytes,Ua: NEGATIVE
Nitrite: NEGATIVE
Protein, ur: NEGATIVE mg/dL
Specific Gravity, Urine: 1.025 (ref 1.005–1.030)
Urobilinogen, UA: 0.2 mg/dL (ref 0.0–1.0)
pH: 5.5 (ref 5.0–8.0)

## 2021-12-06 LAB — POC URINE PREG, ED: Preg Test, Ur: NEGATIVE

## 2021-12-06 MED ORDER — PHENAZOPYRIDINE HCL 200 MG PO TABS: 200.0000 mg | ORAL_TABLET | Freq: Three times a day (TID) | ORAL | 0 refills | Status: DC | PRN

## 2021-12-06 MED ORDER — CEPHALEXIN 500 MG PO CAPS: 500.0000 mg | ORAL_CAPSULE | Freq: Four times a day (QID) | ORAL | 0 refills | Status: AC

## 2021-12-06 NOTE — ED Provider Notes (Signed)
[place]    CSN: [id number] Arrival date & time: 12/06/21  1542      History   Chief Complaint Chief Complaint  Patient presents with   Cystitis    HPI Sherry Leonard is a 50 y.o. female.   Pt complains of difficulty urinating.  Pt reports she feels like she needs to urinate but only a little comes out.  Pt complains of burning with urination.  Pt reports urine is dark and has an odor.  Pt denies any vaginal complaints. No std complaints   The history is provided by the patient. No language interpreter was used.    Past Medical History:  Diagnosis Date   Allergy    Anxiety    Arthritis    Depression    Narcolepsy     Patient Active Problem List   Diagnosis Date Noted   Primary narcolepsy without cataplexy 04/18/2017   Chronic fatigue 04/18/2017   Severe depression ([place]) 04/18/2017   Tobacco use disorder, severe, dependence 04/18/2017   Chronic rhinitis 04/18/2017    Past Surgical History:  Procedure Laterality Date   TUBAL LIGATION      OB History   No obstetric history on file.      Home Medications    Prior to Admission medications   Medication Sig Start Date End Date Taking? Authorizing Provider  albuterol (VENTOLIN HFA) 108 (90 Base) MCG/ACT inhaler Inhale 1-2 puffs into the lungs every 6 (six) hours as needed for wheezing or shortness of breath. 11/22/21   Talbot Grumbling, FNP  benzonatate (TESSALON) 100 MG capsule Take 1 capsule (100 mg total) by mouth every 8 (eight) hours. 11/22/21   Talbot Grumbling, FNP  cetirizine (ZYRTEC) 10 MG tablet Take 1 tablet (10 mg total) by mouth daily. 07/13/21   Scot Jun, FNP  Guaifenesin 1200 MG TB12 Take 1 tablet (1,200 mg total) by mouth in the morning and at bedtime. 11/22/21   Talbot Grumbling, FNP  pantoprazole (PROTONIX) 20 MG tablet Take 1 tablet (20 mg total) by mouth daily. 08/24/21   Carvel Getting, NP  traMADol (ULTRAM) 50 MG tablet Take 1 tablet (50 mg total) by mouth  every 6 (six) hours as needed (pain). 10/25/21   Barrett Henle, MD    Family History Family History  Problem Relation Age of Onset   Cancer Maternal Grandfather     Social History Social History   Tobacco Use   Smoking status: Every Day    Packs/day: 1.00    Types: Cigarettes   Smokeless tobacco: Never  Vaping Use   Vaping Use: Never used  Substance Use Topics   Alcohol use: Yes    Comment: ocassional   Drug use: No     Allergies   Aspirin   Review of Systems Review of Systems  Gastrointestinal:  Positive for nausea.  Genitourinary:  Positive for dysuria, frequency and urgency.  All other systems reviewed and are negative.    Physical Exam Triage Vital Signs ED Triage Vitals  Enc Vitals Group     BP 12/06/21 1701 114/76     Pulse Rate 12/06/21 1701 81     Resp 12/06/21 1701 18     Temp 12/06/21 1701 98.3 F (36.8 C)     Temp src --      SpO2 12/06/21 1701 96 %     Weight --      Height --      Head Circumference --  Peak Flow --      Pain Score 12/06/21 1659 5     Pain Loc --      Pain Edu? --      Excl. in GC? --    No data found.  Updated Vital Signs BP 114/76   Pulse 81   Temp 98.3 F (36.8 C)   Resp 18   LMP  (LMP Unknown)   SpO2 96%   Visual Acuity Right Eye Distance:   Left Eye Distance:   Bilateral Distance:    Right Eye Near:   Left Eye Near:    Bilateral Near:     Physical Exam Constitutional:      Appearance: Normal appearance.  Cardiovascular:     Rate and Rhythm: Normal rate.     Pulses: Normal pulses.  Pulmonary:     Effort: Pulmonary effort is normal.  Abdominal:     General: Abdomen is flat.  Skin:    General: Skin is warm.  Neurological:     General: No focal deficit present.     Mental Status: She is alert.  Psychiatric:        Mood and Affect: Mood normal.      UC Treatments / Results  Labs (all labs ordered are listed, but only abnormal results are displayed) Labs Reviewed  POCT  URINALYSIS DIPSTICK, ED / UC  POC URINE PREG, ED  CERVICOVAGINAL ANCILLARY ONLY    EKG   Radiology No results found.  Procedures Procedures (including critical care time)  Medications Ordered in UC Medications - No data to display  Initial Impression / Assessment and Plan / UC Course  I have reviewed the triage vital signs and the nursing notes.  Pertinent labs & imaging results that were available during my care of the patient were reviewed by me and considered in my medical decision making (see chart for details).     MDM:  Pt's symptoms sound like a uti.  Ua is negative.  Pt has had uti;s in the past and this feels the same.  Vaginal swab pending.  I will culture urine  Final Clinical Impressions(s) / UC Diagnoses   Final diagnoses:  Dysuria     Discharge Instructions      Return if any problems.  You have test pending    ED Prescriptions     Medication Sig Dispense Auth. Provider   phenazopyridine (PYRIDIUM) 200 MG tablet Take 1 tablet (200 mg total) by mouth 3 (three) times daily as needed for pain. 9 tablet Deanthony Maull K, PA-C   cephALEXin (KEFLEX) 500 MG capsule Take 1 capsule (500 mg total) by mouth 4 (four) times daily for 7 days. 28 capsule Elson Areas, [place]      PDMP not reviewed this encounter.   Elson Areas, [place] 12/06/21 3875

## 2021-12-06 NOTE — Discharge Instructions (Addendum)
Return if any problems.  You have test pending

## 2021-12-06 NOTE — ED Triage Notes (Signed)
PT came in 2 weeks for UTI . Pt reports her son trashed the antibx. She was taking for her tooth .Pt unclear why she was taking anti-bx. Pt is now reporting UTI sx's. Pt has multiple complaints . Pt also reports a cough.  Pt wants a refill on anti-bx for her tooth because it also works for her UTI

## 2021-12-07 ENCOUNTER — Telehealth (HOSPITAL_COMMUNITY): Payer: Self-pay | Admitting: Emergency Medicine

## 2021-12-07 LAB — CERVICOVAGINAL ANCILLARY ONLY
Bacterial Vaginitis (gardnerella): POSITIVE — AB
Candida Glabrata: NEGATIVE
Candida Vaginitis: NEGATIVE
Chlamydia: NEGATIVE
Comment: NEGATIVE
Comment: NEGATIVE
Comment: NEGATIVE
Comment: NEGATIVE
Comment: NEGATIVE
Comment: NORMAL
Neisseria Gonorrhea: NEGATIVE
Trichomonas: NEGATIVE

## 2021-12-07 MED ORDER — METRONIDAZOLE 500 MG PO TABS: 500.0000 mg | ORAL_TABLET | Freq: Two times a day (BID) | ORAL | 0 refills | Status: DC

## 2022-02-22 ENCOUNTER — Encounter (HOSPITAL_BASED_OUTPATIENT_CLINIC_OR_DEPARTMENT_OTHER): Payer: Self-pay

## 2022-02-22 ENCOUNTER — Ambulatory Visit (INDEPENDENT_AMBULATORY_CARE_PROVIDER_SITE_OTHER)
Admission: EM | Admit: 2022-02-22 | Discharge: 2022-02-22 | Disposition: A | Discharge status: Home / Self Care | Payer: 59 | Source: Home / Self Care | Attending: Family Medicine | Admitting: Family Medicine

## 2022-02-22 ENCOUNTER — Inpatient Hospital Stay (HOSPITAL_BASED_OUTPATIENT_CLINIC_OR_DEPARTMENT_OTHER)
Admission: EM | Admit: 2022-02-22 | Discharge: 2022-02-26 | DRG: 872 | Disposition: A | Discharge status: Home / Self Care | Payer: 59 | Attending: Internal Medicine | Admitting: Internal Medicine

## 2022-02-22 ENCOUNTER — Other Ambulatory Visit: Payer: Self-pay

## 2022-02-22 ENCOUNTER — Encounter (HOSPITAL_COMMUNITY): Payer: Self-pay | Admitting: *Deleted

## 2022-02-22 ENCOUNTER — Emergency Department (HOSPITAL_BASED_OUTPATIENT_CLINIC_OR_DEPARTMENT_OTHER): Payer: 59

## 2022-02-22 DIAGNOSIS — N10 Acute tubulo-interstitial nephritis: Secondary | ICD-10-CM | POA: Diagnosis present

## 2022-02-22 DIAGNOSIS — E876 Hypokalemia: Secondary | ICD-10-CM | POA: Diagnosis present

## 2022-02-22 DIAGNOSIS — A419 Sepsis, unspecified organism: Principal | ICD-10-CM | POA: Diagnosis present

## 2022-02-22 DIAGNOSIS — R7989 Other specified abnormal findings of blood chemistry: Secondary | ICD-10-CM

## 2022-02-22 DIAGNOSIS — N390 Urinary tract infection, site not specified: Secondary | ICD-10-CM

## 2022-02-22 DIAGNOSIS — R109 Unspecified abdominal pain: Secondary | ICD-10-CM

## 2022-02-22 DIAGNOSIS — R17 Unspecified jaundice: Secondary | ICD-10-CM | POA: Diagnosis present

## 2022-02-22 DIAGNOSIS — R7401 Elevation of levels of liver transaminase levels: Secondary | ICD-10-CM | POA: Diagnosis present

## 2022-02-22 DIAGNOSIS — Z886 Allergy status to analgesic agent status: Secondary | ICD-10-CM

## 2022-02-22 DIAGNOSIS — K219 Gastro-esophageal reflux disease without esophagitis: Secondary | ICD-10-CM | POA: Diagnosis present

## 2022-02-22 DIAGNOSIS — Z3202 Encounter for pregnancy test, result negative: Secondary | ICD-10-CM | POA: Diagnosis not present

## 2022-02-22 DIAGNOSIS — Z809 Family history of malignant neoplasm, unspecified: Secondary | ICD-10-CM

## 2022-02-22 DIAGNOSIS — F17210 Nicotine dependence, cigarettes, uncomplicated: Secondary | ICD-10-CM | POA: Diagnosis present

## 2022-02-22 DIAGNOSIS — G47411 Narcolepsy with cataplexy: Secondary | ICD-10-CM | POA: Diagnosis present

## 2022-02-22 DIAGNOSIS — D509 Iron deficiency anemia, unspecified: Secondary | ICD-10-CM | POA: Diagnosis present

## 2022-02-22 DIAGNOSIS — R1084 Generalized abdominal pain: Principal | ICD-10-CM

## 2022-02-22 DIAGNOSIS — Z1152 Encounter for screening for COVID-19: Secondary | ICD-10-CM

## 2022-02-22 DIAGNOSIS — F17200 Nicotine dependence, unspecified, uncomplicated: Secondary | ICD-10-CM | POA: Diagnosis present

## 2022-02-22 DIAGNOSIS — Z79899 Other long term (current) drug therapy: Secondary | ICD-10-CM

## 2022-02-22 DIAGNOSIS — N12 Tubulo-interstitial nephritis, not specified as acute or chronic: Secondary | ICD-10-CM | POA: Diagnosis not present

## 2022-02-22 DIAGNOSIS — E871 Hypo-osmolality and hyponatremia: Secondary | ICD-10-CM | POA: Diagnosis present

## 2022-02-22 LAB — CBC
HCT: 33.5 % — ABNORMAL LOW (ref 36.0–46.0)
Hemoglobin: 11.3 g/dL — ABNORMAL LOW (ref 12.0–15.0)
MCH: 24.7 pg — ABNORMAL LOW (ref 26.0–34.0)
MCHC: 33.7 g/dL (ref 30.0–36.0)
MCV: 73.3 fL — ABNORMAL LOW (ref 80.0–100.0)
Platelets: 183 10*3/uL (ref 150–400)
RBC: 4.57 MIL/uL (ref 3.87–5.11)
RDW: 13.2 % (ref 11.5–15.5)
WBC: 15.5 10*3/uL — ABNORMAL HIGH (ref 4.0–10.5)
nRBC: 0 % (ref 0.0–0.2)

## 2022-02-22 LAB — RESP PANEL BY RT-PCR (RSV, FLU A&B, COVID)  RVPGX2
Influenza A by PCR: NEGATIVE
Influenza B by PCR: NEGATIVE
Resp Syncytial Virus by PCR: NEGATIVE
SARS Coronavirus 2 by RT PCR: NEGATIVE

## 2022-02-22 LAB — POCT URINALYSIS DIPSTICK, ED / UC
Glucose, UA: 100 mg/dL — AB
Ketones, ur: NEGATIVE mg/dL
Nitrite: POSITIVE — AB
Protein, ur: >=300 mg/dL — AB
Specific Gravity, Urine: 1.01 (ref 1.005–1.030)
Urobilinogen, UA: >=8 mg/dL (ref 0.0–1.0)
pH: 5.5 (ref 5.0–8.0)

## 2022-02-22 LAB — COMPREHENSIVE METABOLIC PANEL
ALT: 257 U/L — ABNORMAL HIGH (ref 0–44)
AST: 188 U/L — ABNORMAL HIGH (ref 15–41)
Albumin: 3.9 g/dL (ref 3.5–5.0)
Alkaline Phosphatase: 181 U/L — ABNORMAL HIGH (ref 38–126)
Anion gap: 11 (ref 5–15)
BUN: 10 mg/dL (ref 6–20)
CO2: 26 mmol/L (ref 22–32)
Calcium: 10.3 mg/dL (ref 8.9–10.3)
Chloride: 95 mmol/L — ABNORMAL LOW (ref 98–111)
Creatinine, Ser: 0.91 mg/dL (ref 0.44–1.00)
GFR, Estimated: >60 mL/min (ref >60)
Glucose, Bld: 162 mg/dL — ABNORMAL HIGH (ref 70–99)
Potassium: 3.6 mmol/L (ref 3.5–5.1)
Sodium: 132 mmol/L — ABNORMAL LOW (ref 135–145)
Total Bilirubin: 2.1 mg/dL — ABNORMAL HIGH (ref 0.3–1.2)
Total Protein: 8.4 g/dL — ABNORMAL HIGH (ref 6.5–8.1)

## 2022-02-22 LAB — LIPASE, BLOOD: Lipase: 14 U/L (ref 11–51)

## 2022-02-22 LAB — POC URINE PREG, ED: Preg Test, Ur: NEGATIVE

## 2022-02-22 MED ORDER — CIPROFLOXACIN HCL 500 MG PO TABS: 500.0000 mg | ORAL_TABLET | Freq: Once | ORAL | Status: DC

## 2022-02-22 MED ORDER — SODIUM CHLORIDE 0.9 % IV BOLUS
1000.0000 mL | Freq: Once | INTRAVENOUS | Status: AC
  Administered 2022-02-23: 1000 mL via INTRAVENOUS

## 2022-02-22 MED ORDER — SODIUM CHLORIDE 0.9 % IV SOLN
1.0000 g | Freq: Once | INTRAVENOUS | Status: AC
  Administered 2022-02-23: 1 g via INTRAVENOUS
  Filled 2022-02-22: qty 10

## 2022-02-22 NOTE — ED Provider Notes (Addendum)
[facility]    CSN: [id number] Arrival date & time: 02/22/22  1709      History   Chief Complaint Chief Complaint  Patient presents with   Abdominal Pain   Fever    HPI Sherry Leonard is a 52 y.o. female.    Abdominal Pain Associated symptoms: fever   Fever  Here for abdominal pain and fever that began in the last 3 to 4 days.  It is now hurting so bad that she is having a hard time standing up.  She has had a small watery stool this morning with a little ball of hard stool in it.  She has not had any dysuria, but it hurts when she Valsalva's to urinate.  She has been drinking lots of fluids.  She feels very dry.  She notes the pain in the lower quadrants when I first asked her, but then when being examined she notes pain in her epigastrium and right upper quadrant also.  She does note a little bit of crusty nasal congestion and cough that happened on January 3  Past Medical History:  Diagnosis Date   Allergy    Anxiety    Arthritis    Depression    Narcolepsy     Patient Active Problem List   Diagnosis Date Noted   Primary narcolepsy without cataplexy 04/18/2017   Chronic fatigue 04/18/2017   Severe depression ([place]) 04/18/2017   Tobacco use disorder, severe, dependence 04/18/2017   Chronic rhinitis 04/18/2017    Past Surgical History:  Procedure Laterality Date   TUBAL LIGATION      OB History   No obstetric history on file.      Home Medications    Prior to Admission medications   Medication Sig Start Date End Date Taking? Authorizing Provider  albuterol (VENTOLIN HFA) 108 (90 Base) MCG/ACT inhaler Inhale 1-2 puffs into the lungs every 6 (six) hours as needed for wheezing or shortness of breath. 11/22/21  Yes Talbot Grumbling, FNP  benzonatate (TESSALON) 100 MG capsule Take 1 capsule (100 mg total) by mouth every 8 (eight) hours. 11/22/21   Talbot Grumbling, FNP  cetirizine (ZYRTEC) 10 MG tablet Take 1 tablet (10 mg total) by mouth  daily. 07/13/21   Scot Jun, FNP  Guaifenesin 1200 MG TB12 Take 1 tablet (1,200 mg total) by mouth in the morning and at bedtime. 11/22/21   Talbot Grumbling, FNP  metroNIDAZOLE (FLAGYL) 500 MG tablet Take 1 tablet (500 mg total) by mouth 2 (two) times daily. 12/07/21   Chase Picket, MD  pantoprazole (PROTONIX) 20 MG tablet Take 1 tablet (20 mg total) by mouth daily. 08/24/21   Carvel Getting, NP  phenazopyridine (PYRIDIUM) 200 MG tablet Take 1 tablet (200 mg total) by mouth 3 (three) times daily as needed for pain. 12/06/21 12/06/22  Fransico Meadow, PA-C  traMADol (ULTRAM) 50 MG tablet Take 1 tablet (50 mg total) by mouth every 6 (six) hours as needed (pain). 10/25/21   Barrett Henle, MD    Family History Family History  Problem Relation Age of Onset   Cancer Maternal Grandfather     Social History Social History   Tobacco Use   Smoking status: Every Day    Packs/day: 1.00    Types: Cigarettes   Smokeless tobacco: Never  Vaping Use   Vaping Use: Never used  Substance Use Topics   Alcohol use: Yes    Comment: rare   Drug use:  No     Allergies   Aspirin   Review of Systems Review of Systems  Constitutional:  Positive for fever.  Gastrointestinal:  Positive for abdominal pain.     Physical Exam Triage Vital Signs ED Triage Vitals  Enc Vitals Group     BP 02/22/22 1758 114/83     Pulse Rate 02/22/22 1758 (!) 118     Resp 02/22/22 1758 16     Temp 02/22/22 1758 (!) 102.3 F (39.1 C)     Temp Source 02/22/22 1758 Oral     SpO2 02/22/22 1758 99 %     Weight --      Height --      Head Circumference --      Peak Flow --      Pain Score 02/22/22 1801 8     Pain Loc --      Pain Edu? --      Excl. in [place]? --    No data found.  Updated Vital Signs BP 114/83   Pulse (!) 118   Temp (!) 102.3 F (39.1 C) (Oral)   Resp 16   SpO2 99%   Visual Acuity Right Eye Distance:   Left Eye Distance:   Bilateral Distance:    Right Eye Near:    Left Eye Near:    Bilateral Near:     Physical Exam Vitals reviewed.  Constitutional:      Appearance: She is not diaphoretic.     Comments: She is in obvious discomfort walking from the bathroom to the exam room.  She has a hard time straightening out to lie down.  HENT:     Nose: Nose normal.     Mouth/Throat:     Comments: Mucous membranes are pink Eyes:     Extraocular Movements: Extraocular movements intact.     Pupils: Pupils are equal, round, and reactive to light.     Comments: There is a hint of possible jaundice on her conjunctiva.  Cardiovascular:     Rate and Rhythm: Normal rate and regular rhythm.     Heart sounds: No murmur heard. Pulmonary:     Effort: Pulmonary effort is normal. No respiratory distress.     Breath sounds: Normal breath sounds. No stridor. No wheezing, rhonchi or rales.  Abdominal:     Palpations: Abdomen is soft. There is no mass.     Tenderness: There is abdominal tenderness (But gastrium, right upper quadrant, and right lower quadrant and suprapubic area.).  Musculoskeletal:     Cervical back: Neck supple.  Lymphadenopathy:     Cervical: No cervical adenopathy.  Skin:    Coloration: Skin is not jaundiced or pale.  Neurological:     General: No focal deficit present.     Mental Status: She is alert and oriented to person, place, and time.  Psychiatric:        Behavior: Behavior normal.      UC Treatments / Results  Labs (all labs ordered are listed, but only abnormal results are displayed) Labs Reviewed  POCT URINALYSIS DIPSTICK, ED / UC - Abnormal; Notable for the following components:      Result Value   Glucose, UA 100 (*)    Bilirubin Urine MODERATE (*)    Hgb urine dipstick SMALL (*)    Protein, ur >=300 (*)    Nitrite POSITIVE (*)    Leukocytes,Ua MODERATE (*)    All other components within normal limits  POC URINE PREG, ED  CERVICOVAGINAL ANCILLARY ONLY    EKG   Radiology No results  found.  Procedures Procedures (including critical care time)  Medications Ordered in UC Medications - No data to display  Initial Impression / Assessment and Plan / UC Course  I have reviewed the triage vital signs and the nursing notes.  Pertinent labs & imaging results that were available during my care of the patient were reviewed by me and considered in my medical decision making (see chart for details).        Urinalysis shows pyuria and hemoglobin and nitrites.  UPT is negative.  Urine culture is sent.  She is given 1 dose of Cipro here.  She is allergic to aspirin and I have discussed with the patient that we cannot adequately relieve her pain here in the urgent care, as all we have injectable is Toradol.  I am concerned that she needs urgent evaluation with at least labs to evaluate her electrolytes and fluid status.  She is going to proceed to the emergency room for further evaluation  Her son is coming to drive her to the emergency room.  We do not have any oral antibiotics to administer here other than azithromycin, which would not do anything for starting UTI treatment.  Therefore that order is canceled. Final Clinical Impressions(s) / UC Diagnoses   Final diagnoses:  None   Discharge Instructions   None    ED Prescriptions   None    PDMP not reviewed this encounter.   Zenia Resides, MD 02/22/22 Luiz Iron    Zenia Resides, MD 02/22/22 [phone]

## 2022-02-22 NOTE — ED Notes (Signed)
Patient is being discharged from the Urgent Care and sent to the Emergency Department via personal vehicle. Per DR Windy Carina, patient is in need of higher level of care due to abdominal pain. Patient is aware and verbalizes understanding of plan of care.  Vitals:   02/22/22 1758  BP: 114/83  Pulse: (!) 118  Resp: 16  Temp: (!) 102.3 F (39.1 C)  SpO2: 99%

## 2022-02-22 NOTE — Discharge Instructions (Signed)
Please proceed to the emergency room for higher level of evaluation and care than we can provide here in the urgent care setting.

## 2022-02-22 NOTE — ED Provider Notes (Signed)
Emergency Department Provider Note   I have reviewed the triage vital signs and the nursing notes.   HISTORY  Chief Complaint Dysuria   HPI Sherry Leonard is a 52 y.o. female past history reviewed below presents emergency department with dysuria, bilateral flank/abdominal pain, dysuria, and fever.  She was seen yesterday at urgent care for abdominal pain and fever.  She was given a dose of Cipro at the urgent care and sent to the ED for further evaluation given the level of pain.  Urine was sent for culture at that time.  She has been taking AZO at home previously without relief. No upper abdominal pain or vomiting. No known history of hepatitis. Notes drinking EtOH only occasionally.    Past Medical History:  Diagnosis Date   Allergy    Anxiety    Arthritis    Depression    Narcolepsy     Review of Systems  Constitutional: Positive fever/chills Cardiovascular: Denies chest pain. Respiratory: Denies shortness of breath. Gastrointestinal: Positive lower abdominal pain. Positive nausea, no vomiting.  No diarrhea.  No constipation. Genitourinary: Positive for dysuria. Musculoskeletal: Positive for back pain. Skin: Negative for rash. Neurological: Negative for headaches, focal weakness or numbness.   ____________________________________________   PHYSICAL EXAM:  VITAL SIGNS: ED Triage Vitals  Enc Vitals Group     BP 02/22/22 2134 120/84     Pulse Rate 02/22/22 2134 (!) 117     Resp 02/22/22 2134 18     Temp 02/22/22 2134 99.3 F (37.4 C)     Temp Source 02/22/22 2134 Oral     SpO2 02/22/22 2134 100 %     Weight 02/22/22 2132 169 lb 15.6 oz (77.1 kg)     Height 02/22/22 2132 5\' 6"  (1.676 m)   Constitutional: Alert and oriented. Well appearing and in no acute distress. Eyes: Conjunctivae are normal. Head: Atraumatic. Nose: No congestion/rhinnorhea. Mouth/Throat: Mucous membranes are moist.  Neck: No stridor.  Cardiovascular: Tachycardia. Good peripheral  circulation. Grossly normal heart sounds.   Respiratory: Normal respiratory effort.  No retractions. Lungs CTAB. Gastrointestinal: Soft and non-tender in the epigastric region and mild tenderness in the lower abdomen. No RUQ tenderness. Negative Murphy's sign. No distention.  Musculoskeletal:  No gross deformities of extremities. Neurologic:  Normal speech and language.  Skin:  Skin is warm, dry and intact. No rash noted.  ____________________________________________   LABS (all labs ordered are listed, but only abnormal results are displayed)  Labs Reviewed  COMPREHENSIVE METABOLIC PANEL - Abnormal; Notable for the following components:      Result Value   Sodium 132 (*)    Chloride 95 (*)    Glucose, Bld 162 (*)    Total Protein 8.4 (*)    AST 188 (*)    ALT 257 (*)    Alkaline Phosphatase 181 (*)    Total Bilirubin 2.1 (*)    All other components within normal limits  CBC - Abnormal; Notable for the following components:   WBC 15.5 (*)    Hemoglobin 11.3 (*)    HCT 33.5 (*)    MCV 73.3 (*)    MCH 24.7 (*)    All other components within normal limits  URINALYSIS, ROUTINE W REFLEX MICROSCOPIC - Abnormal; Notable for the following components:   APPearance CLOUDY (*)    Hgb urine dipstick SMALL (*)    Bilirubin Urine SMALL (*)    Protein, ur 100 (*)    Leukocytes,Ua LARGE (*)    WBC,  UA >50 (*)    Bacteria, UA MANY (*)    Non Squamous Epithelial 0-5 (*)    All other components within normal limits  RESP PANEL BY RT-PCR (RSV, FLU A&B, COVID)  RVPGX2  URINE CULTURE  CULTURE, BLOOD (ROUTINE X 2)  CULTURE, BLOOD (ROUTINE X 2)  LIPASE, BLOOD  HEPATITIS PANEL, ACUTE  ACETAMINOPHEN LEVEL  PROTIME-INR  LACTIC ACID, PLASMA  LACTIC ACID, PLASMA   ____________________________________________  RADIOLOGY  CT ABDOMEN PELVIS W CONTRAST  Result Date: 02/23/2022 CLINICAL DATA:  Lower abdominal pain. EXAM: CT ABDOMEN AND PELVIS WITH CONTRAST TECHNIQUE: Multidetector CT imaging  of the abdomen and pelvis was performed using the standard protocol following bolus administration of intravenous contrast. RADIATION DOSE REDUCTION: This exam was performed according to the departmental dose-optimization program which includes automated exposure control, adjustment of the mA and/or kV according to patient size and/or use of iterative reconstruction technique. CONTRAST:  157mL OMNIPAQUE IOHEXOL 300 MG/ML  SOLN COMPARISON:  01/22/2014 FINDINGS: Lower chest: No acute abnormality. Hepatobiliary: No focal liver abnormality is seen. No gallstones, gallbladder wall thickening, or biliary dilatation. Pancreas: Unremarkable. No pancreatic ductal dilatation or surrounding inflammatory changes. Spleen: Normal in size without focal abnormality. Adrenals/Urinary Tract: Normal adrenal glands. Geographic cortical hypoattenuation in both kidneys compatible with pyelonephritis. Prominent left-greater-than-right renal pelvises with urothelial thickening and hyperenhancement. No obstructing urinary calculi. Unremarkable bladder. Stomach/Bowel: Stomach is within normal limits. Appendix appears normal. No evidence of bowel wall thickening, distention, or inflammatory changes. Vascular/Lymphatic: Aortic atherosclerosis. No enlarged abdominal or pelvic lymph nodes. Reproductive: Uterus and bilateral adnexa are unremarkable. Other: No free intraperitoneal fluid or air. Musculoskeletal: No acute abnormality. IMPRESSION: Bilateral pyelonephritis. Electronically Signed   By: Placido Sou M.D.   On: 02/23/2022 01:45   US Abdomen Limited RUQ (LIVER/GB)  Result Date: 02/23/2022 CLINICAL DATA:  Right upper quadrant pain, fever, and body aches EXAM: ULTRASOUND ABDOMEN LIMITED RIGHT UPPER QUADRANT COMPARISON:  CT 01/22/2014 FINDINGS: Gallbladder: No gallstones or wall thickening visualized. No sonographic Murphy sign noted by sonographer. Common bile duct: Diameter: 3.4 Liver: No focal lesion identified. Within normal  limits in parenchymal echogenicity. Portal vein is patent on color Doppler imaging with normal direction of blood flow towards the liver. Other: None. IMPRESSION: Unremarkable right upper quadrant ultrasound. Electronically Signed   By: Placido Sou M.D.   On: 02/23/2022 00:27    ____________________________________________   PROCEDURES  Procedure(s) performed:   Procedures  None  ____________________________________________   INITIAL IMPRESSION / ASSESSMENT AND PLAN / ED COURSE  Pertinent labs & imaging results that were available during my care of the patient were reviewed by me and considered in my medical decision making (see chart for details).   This patient is Presenting for Evaluation of abdominal pain, which does require a range of treatment options, and is a complaint that involves a high risk of morbidity and mortality.  The Differential Diagnoses includes but is not exclusive to ectopic pregnancy, ovarian cyst, ovarian torsion, acute appendicitis, urinary tract infection, endometriosis, bowel obstruction, hernia, colitis, renal colic, gastroenteritis, volvulus etc.   Critical Interventions-    Medications  sodium chloride 0.9 % bolus 1,000 mL (0 mLs Intravenous Stopped 02/23/22 0125)  cefTRIAXone (ROCEPHIN) 1 g in sodium chloride 0.9 % 100 mL IVPB (0 g Intravenous Stopped 02/23/22 0125)  morphine (PF) 4 MG/ML injection 4 mg (4 mg Intravenous Given 02/23/22 0100)  ondansetron (ZOFRAN) injection 4 mg (4 mg Intravenous Given 02/23/22 0100)  iohexol (OMNIPAQUE) 300 MG/ML solution 100 mL (  100 mLs Intravenous Contrast Given 02/23/22 0100)    Reassessment after intervention: Pain improved and vitals also improving.   I decided to review pertinent External Data, and in summary patient seen at [facility] prior to ED presentation as described above.   Clinical Laboratory Tests Ordered, included patient with leukocytosis to 15.5.  No acute kidney injury.  LFTs are elevated along with  mild elevated bilirubin to 2.1.  UA consistent with infection.  Respiratory panel negative.  Radiologic Tests Ordered, included RUQ [place] and CT abdomen/pelvis. I independently interpreted the images and agree with radiology interpretation.   Cardiac Monitor Tracing which shows tachycardia.   Social Determinants of Health Risk patient is a smoker.   Consult complete with Dr. Antionette Char with [facility]. Plan for admit.   Medical Decision Making: Summary:  Patient presents emergency department abdominal pain, rigors, dysuria.  She has fever and SIRS vitals here.  Generally looks unwell.  Labs obtained showing LFT and bilirubin elevation.  Starting with the right upper quadrant ultrasound, which is normal, followed by CT showing bilateral pyelonephritis without hydronephrosis or ureteral stone. Rocephin given and cultures sent.   Reevaluation with update and discussion with patient feeling slightly better but remains mildly tachycardic into the low 100 range.  No hypotension but given bilateral hydronephrosis along with abnormal LFTs and bilirubin plan for antibiotics, IV fluids, admit for observation.   Patient's presentation is most consistent with acute presentation with potential threat to life or bodily function.   Disposition: admit  ____________________________________________  FINAL CLINICAL IMPRESSION(S) / ED DIAGNOSES  Final diagnoses:  Pyelonephritis  Elevated LFTs    Note:  This document was prepared using Dragon voice recognition software and may include unintentional dictation errors.  Alona Bene, MD, [facility] Emergency Medicine    Virgil Lightner, Arlyss Repress, MD 02/23/22 [phone]

## 2022-02-22 NOTE — ED Triage Notes (Addendum)
C/O polyuria and dysuria onset 02/10/22; states used a test strip and it "showed UTI", so she started taking Uristat. States now having significant low abd pain. RN noticed yellowing of bilat sclera - pt denies hx of liver problems. Pt also reports nasal congestion, body aches, cough onset approx 1 wk ago. Has been taking Tyl Severe Cold & Flu (last dose this AM).

## 2022-02-22 NOTE — ED Triage Notes (Signed)
Patient here POV from Home.  Endorses Polyuria and Dysuria that began Late December. Utilized Home UTI Kit which demonstrated UTI. Pain returned and now the Patient is having Lower ABD Pain as well as some URI Symptoms such as Aches, Cough, Congestion.    Seen at [facility] and Sent for Evaluation.  NAD Noted during Triage. A&Ox4. GCS 15. Ambulatory.

## 2022-02-23 ENCOUNTER — Emergency Department (HOSPITAL_BASED_OUTPATIENT_CLINIC_OR_DEPARTMENT_OTHER): Payer: 59

## 2022-02-23 ENCOUNTER — Encounter (HOSPITAL_COMMUNITY): Payer: Self-pay | Admitting: Family Medicine

## 2022-02-23 DIAGNOSIS — R17 Unspecified jaundice: Secondary | ICD-10-CM | POA: Diagnosis present

## 2022-02-23 DIAGNOSIS — Z79899 Other long term (current) drug therapy: Secondary | ICD-10-CM | POA: Diagnosis not present

## 2022-02-23 DIAGNOSIS — N12 Tubulo-interstitial nephritis, not specified as acute or chronic: Secondary | ICD-10-CM

## 2022-02-23 DIAGNOSIS — K219 Gastro-esophageal reflux disease without esophagitis: Secondary | ICD-10-CM | POA: Diagnosis present

## 2022-02-23 DIAGNOSIS — A419 Sepsis, unspecified organism: Secondary | ICD-10-CM | POA: Diagnosis not present

## 2022-02-23 DIAGNOSIS — Z809 Family history of malignant neoplasm, unspecified: Secondary | ICD-10-CM | POA: Diagnosis not present

## 2022-02-23 DIAGNOSIS — G47411 Narcolepsy with cataplexy: Secondary | ICD-10-CM | POA: Diagnosis present

## 2022-02-23 DIAGNOSIS — R7401 Elevation of levels of liver transaminase levels: Secondary | ICD-10-CM | POA: Diagnosis present

## 2022-02-23 DIAGNOSIS — Z1152 Encounter for screening for COVID-19: Secondary | ICD-10-CM | POA: Diagnosis not present

## 2022-02-23 DIAGNOSIS — D509 Iron deficiency anemia, unspecified: Secondary | ICD-10-CM | POA: Diagnosis present

## 2022-02-23 DIAGNOSIS — F17210 Nicotine dependence, cigarettes, uncomplicated: Secondary | ICD-10-CM | POA: Diagnosis present

## 2022-02-23 DIAGNOSIS — N10 Acute tubulo-interstitial nephritis: Secondary | ICD-10-CM | POA: Diagnosis present

## 2022-02-23 DIAGNOSIS — F17200 Nicotine dependence, unspecified, uncomplicated: Secondary | ICD-10-CM

## 2022-02-23 DIAGNOSIS — E876 Hypokalemia: Secondary | ICD-10-CM | POA: Diagnosis not present

## 2022-02-23 DIAGNOSIS — E871 Hypo-osmolality and hyponatremia: Secondary | ICD-10-CM | POA: Diagnosis present

## 2022-02-23 DIAGNOSIS — Z886 Allergy status to analgesic agent status: Secondary | ICD-10-CM | POA: Diagnosis not present

## 2022-02-23 HISTORY — DX: Tubulo-interstitial nephritis, not specified as acute or chronic: N12

## 2022-02-23 HISTORY — DX: Hypokalemia: E87.6

## 2022-02-23 HISTORY — DX: Elevation of levels of liver transaminase levels: R74.01

## 2022-02-23 HISTORY — DX: Hypo-osmolality and hyponatremia: E87.1

## 2022-02-23 HISTORY — DX: Sepsis, unspecified organism: A41.9

## 2022-02-23 LAB — CBC
HCT: 28.2 % — ABNORMAL LOW (ref 36.0–46.0)
Hemoglobin: 10 g/dL — ABNORMAL LOW (ref 12.0–15.0)
MCH: 25.4 pg — ABNORMAL LOW (ref 26.0–34.0)
MCHC: 35.5 g/dL (ref 30.0–36.0)
MCV: 71.6 fL — ABNORMAL LOW (ref 80.0–100.0)
Platelets: 149 10*3/uL — ABNORMAL LOW (ref 150–400)
RBC: 3.94 MIL/uL (ref 3.87–5.11)
RDW: 12.7 % (ref 11.5–15.5)
WBC: 19.4 10*3/uL — ABNORMAL HIGH (ref 4.0–10.5)
nRBC: 0 % (ref 0.0–0.2)

## 2022-02-23 LAB — CERVICOVAGINAL ANCILLARY ONLY
Bacterial Vaginitis (gardnerella): POSITIVE — AB
Candida Glabrata: NEGATIVE
Candida Vaginitis: NEGATIVE
Chlamydia: NEGATIVE
Comment: NEGATIVE
Comment: NEGATIVE
Comment: NEGATIVE
Comment: NEGATIVE
Comment: NEGATIVE
Comment: NORMAL
Neisseria Gonorrhea: NEGATIVE
Trichomonas: NEGATIVE

## 2022-02-23 LAB — URINALYSIS, ROUTINE W REFLEX MICROSCOPIC
Glucose, UA: NEGATIVE mg/dL
Ketones, ur: NEGATIVE mg/dL
Nitrite: NEGATIVE
Protein, ur: 100 mg/dL — AB
Specific Gravity, Urine: 1.01 (ref 1.005–1.030)
WBC, UA: >50 WBC/hpf — ABNORMAL HIGH (ref 0–5)
pH: 6 (ref 5.0–8.0)

## 2022-02-23 LAB — COMPREHENSIVE METABOLIC PANEL
ALT: 211 U/L — ABNORMAL HIGH (ref 0–44)
AST: 147 U/L — ABNORMAL HIGH (ref 15–41)
Albumin: 2.5 g/dL — ABNORMAL LOW (ref 3.5–5.0)
Alkaline Phosphatase: 159 U/L — ABNORMAL HIGH (ref 38–126)
Anion gap: 10 (ref 5–15)
BUN: <5 mg/dL — ABNORMAL LOW (ref 6–20)
CO2: 26 mmol/L (ref 22–32)
Calcium: 8.9 mg/dL (ref 8.9–10.3)
Chloride: 97 mmol/L — ABNORMAL LOW (ref 98–111)
Creatinine, Ser: 0.94 mg/dL (ref 0.44–1.00)
GFR, Estimated: >60 mL/min (ref >60)
Glucose, Bld: 121 mg/dL — ABNORMAL HIGH (ref 70–99)
Potassium: 3.1 mmol/L — ABNORMAL LOW (ref 3.5–5.1)
Sodium: 133 mmol/L — ABNORMAL LOW (ref 135–145)
Total Bilirubin: 2.6 mg/dL — ABNORMAL HIGH (ref 0.3–1.2)
Total Protein: 6.7 g/dL (ref 6.5–8.1)

## 2022-02-23 LAB — HEPATITIS PANEL, ACUTE
HCV Ab: NONREACTIVE
Hep A IgM: NONREACTIVE
Hep B C IgM: NONREACTIVE
Hepatitis B Surface Ag: NONREACTIVE

## 2022-02-23 LAB — LACTIC ACID, PLASMA
Lactic Acid, Venous: 1.3 mmol/L (ref 0.5–1.9)
Lactic Acid, Venous: 1.5 mmol/L (ref 0.5–1.9)

## 2022-02-23 LAB — PROTIME-INR
INR: 1.1 (ref 0.8–1.2)
Prothrombin Time: 14.5 seconds (ref 11.4–15.2)

## 2022-02-23 LAB — ACETAMINOPHEN LEVEL: Acetaminophen (Tylenol), Serum: <10 ug/mL — ABNORMAL LOW (ref 10–30)

## 2022-02-23 MED ORDER — MORPHINE SULFATE (PF) 4 MG/ML IV SOLN
4.0000 mg | Freq: Once | INTRAVENOUS | Status: AC
  Administered 2022-02-23: 4 mg via INTRAVENOUS
  Filled 2022-02-23: qty 1

## 2022-02-23 MED ORDER — MORPHINE SULFATE (PF) 2 MG/ML IV SOLN
2.0000 mg | INTRAVENOUS | Status: DC | PRN
  Administered 2022-02-23 – 2022-02-24 (×3): 2 mg via INTRAVENOUS
  Filled 2022-02-23 (×3): qty 1

## 2022-02-23 MED ORDER — OXYCODONE HCL 5 MG PO TABS: 5.0000 mg | ORAL_TABLET | Freq: Four times a day (QID) | ORAL | Status: DC | PRN

## 2022-02-23 MED ORDER — FLUTICASONE PROPIONATE 50 MCG/ACT NA SUSP
2.0000 | Freq: Every day | NASAL | Status: DC
  Administered 2022-02-24 – 2022-02-26 (×3): 2 via NASAL
  Filled 2022-02-23: qty 16

## 2022-02-23 MED ORDER — NICOTINE 21 MG/24HR TD PT24
21.0000 mg | MEDICATED_PATCH | Freq: Every day | TRANSDERMAL | Status: DC
  Administered 2022-02-23 – 2022-02-26 (×3): 21 mg via TRANSDERMAL
  Filled 2022-02-23 (×4): qty 1

## 2022-02-23 MED ORDER — ENOXAPARIN SODIUM 40 MG/0.4ML IJ SOSY
40.0000 mg | PREFILLED_SYRINGE | INTRAMUSCULAR | Status: DC
  Filled 2022-02-23 (×3): qty 0.4

## 2022-02-23 MED ORDER — LACTATED RINGERS IV SOLN: INTRAVENOUS | Status: DC

## 2022-02-23 MED ORDER — LACTATED RINGERS IV SOLN: Freq: Once | INTRAVENOUS | Status: AC

## 2022-02-23 MED ORDER — GUAIFENESIN ER 600 MG PO TB12
600.0000 mg | ORAL_TABLET | Freq: Two times a day (BID) | ORAL | Status: DC
  Administered 2022-02-23 – 2022-02-26 (×7): 600 mg via ORAL
  Filled 2022-02-23 (×7): qty 1

## 2022-02-23 MED ORDER — PANTOPRAZOLE SODIUM 20 MG PO TBEC
20.0000 mg | DELAYED_RELEASE_TABLET | Freq: Every day | ORAL | Status: DC
  Administered 2022-02-23 – 2022-02-26 (×4): 20 mg via ORAL
  Filled 2022-02-23 (×4): qty 1

## 2022-02-23 MED ORDER — ACETAMINOPHEN 325 MG PO TABS
650.0000 mg | ORAL_TABLET | Freq: Four times a day (QID) | ORAL | Status: DC | PRN
  Administered 2022-02-23 – 2022-02-26 (×6): 650 mg via ORAL
  Filled 2022-02-23 (×6): qty 2

## 2022-02-23 MED ORDER — DIPHENHYDRAMINE HCL 25 MG PO CAPS: 25.0000 mg | ORAL_CAPSULE | Freq: Four times a day (QID) | ORAL | Status: DC | PRN

## 2022-02-23 MED ORDER — SODIUM CHLORIDE 0.9% FLUSH
3.0000 mL | Freq: Two times a day (BID) | INTRAVENOUS | Status: DC
  Administered 2022-02-23 – 2022-02-25 (×5): 3 mL via INTRAVENOUS

## 2022-02-23 MED ORDER — ONDANSETRON HCL 4 MG/2ML IJ SOLN
4.0000 mg | Freq: Once | INTRAMUSCULAR | Status: AC
  Administered 2022-02-23: 4 mg via INTRAVENOUS
  Filled 2022-02-23: qty 2

## 2022-02-23 MED ORDER — ACETAMINOPHEN 650 MG RE SUPP: 650.0000 mg | Freq: Four times a day (QID) | RECTAL | Status: DC | PRN

## 2022-02-23 MED ORDER — POTASSIUM CHLORIDE CRYS ER 20 MEQ PO TBCR
40.0000 meq | EXTENDED_RELEASE_TABLET | ORAL | Status: AC
  Administered 2022-02-23: 40 meq via ORAL
  Filled 2022-02-23: qty 2

## 2022-02-23 MED ORDER — IOHEXOL 300 MG/ML  SOLN
100.0000 mL | Freq: Once | INTRAMUSCULAR | Status: AC | PRN
  Administered 2022-02-23: 100 mL via INTRAVENOUS

## 2022-02-23 MED ORDER — ALBUTEROL SULFATE (2.5 MG/3ML) 0.083% IN NEBU: 2.5000 mg | INHALATION_SOLUTION | Freq: Four times a day (QID) | RESPIRATORY_TRACT | Status: DC | PRN

## 2022-02-23 MED ORDER — SODIUM CHLORIDE 0.9 % IV SOLN
2.0000 g | INTRAVENOUS | Status: AC
  Administered 2022-02-23 – 2022-02-25 (×3): 2 g via INTRAVENOUS
  Filled 2022-02-23 (×3): qty 20

## 2022-02-23 NOTE — Progress Notes (Signed)
Plan of Care Note for accepted transfer   Patient: Tyneisha Hegeman MRN: [id number]   [place]: 02/22/2022  Facility requesting transfer: [facility]   Requesting Provider: Dr. Laverta [place]   Reason for transfer: Pyelonephritis, elevated LFTs   Facility course: 52 yr old lady with hx of depression and narcolepsy with cataplexy who presents with multiple complaints including >1 wk of urinary sxs, ~1 wk of upper respiratory sxs, and more recent fever and abdominal pain.   She is febrile in ED with HR in 110s and stable BP. Labs notable for WBC 15,500, alk phos 181, AST 188, ALT 257, and t bili 2.1. RUQ [place] is unremarkable. CT abd/pelvis demonstrates b/l pyelonephritis but no biliary ductal dilatation, cholelithiasis, or other acute hepatobiliary findings.   Blood culture, APAP level, viral hepatitis panel, INR, and lactic acid have been ordered and she was treated with 1L NS, 1g IV Rocephin, Zofran, and morphine.   Plan of care: The patient is accepted for admission to [place] unit, at [facility].   Author: Vianne Bulls, MD 02/23/2022  Check [email or web address] for on-call coverage.  Nursing staff, Please call [place] number on Amion as soon as patient arrives so that the appropriate admitting provider can be sent to evaluate the pt.

## 2022-02-23 NOTE — Progress Notes (Signed)
Patient arrived to 2W36 via ambulance from [facility]. [facility] admits made aware.

## 2022-02-23 NOTE — ED Notes (Signed)
Report attempted via phone- asked to call back in 5 minutes.

## 2022-02-23 NOTE — Plan of Care (Signed)

## 2022-02-23 NOTE — H&P (Signed)
History and Physical    Patient: Sherry Leonard ZDG:[id number] DOB: [birth year] DOA: 02/22/2022 DOS: the patient was seen and examined on 02/23/2022 PCP: Pcp, No  Patient coming from: Transfer from [place]:  Chief Complaint  Patient presents with   Dysuria   HPI: Sherry Leonard is a 52 y.o. female with medical history significant of depression, narcolepsy with cataplexy, and GERD who presents with complaints of pain with urination over the 2 weeksShe states she has had a burning pain when trying to urinate with lower abdominal pain and reports of urinary frequency. Pain was so severe that she was having a difficult time walking.  She had done a at home test strip which came back positive for concern for urinary tract infection.  She was taking Uristat along with Azo for symptoms, but had not noticed any improvement.  Other associated symptoms included nasal congestion, cough, body aches, and poor p.o intake.  She had been taking Tylenol severe cold and flu at home for her nasal congestion without significant improvement.   Emergency department patient was noted to be febrile up to 102.3 F with tachycardia and tachypnea.  Labs from 1/10 significant for WBC 15.5, hemoglobin 11.3, sodium 132, glucose 162, alkaline phosphatase 181, AST 88, ALT 257, total bilirubin 2.1, and lactic acid within normal limits x 2.  Urinalysis significant for small hemoglobin, large leukocytes, many bacteria, 0-5 RBCs/hpf, and greater than 50 RBC/hpf.  CT scan of the abdomen pelvis noted bilateral pyelonephritis.  Patient has been given 1 L of normal saline IV fluids, morphine 4 mg IV, Zofran, and Rocephin IV  Review of Systems: As mentioned in the history of present illness. All other systems reviewed and are negative. Past Medical History:  Diagnosis Date   Allergy    Anxiety    Arthritis    Depression    Narcolepsy    Past Surgical History:  Procedure Laterality Date   TUBAL LIGATION     Social  History:  reports that she has been smoking cigarettes. She has been smoking an average of 2 packs per day. She has never used smokeless tobacco. She reports current alcohol use. She reports that she does not use drugs.  Allergies  Allergen Reactions   Aspirin     Itch Able to tolerate Tylenol    Family History  Problem Relation Age of Onset   Cancer Maternal Grandfather     Prior to Admission medications   Medication Sig Start Date End Date Taking? Authorizing Provider  albuterol (VENTOLIN HFA) 108 (90 Base) MCG/ACT inhaler Inhale 1-2 puffs into the lungs every 6 (six) hours as needed for wheezing or shortness of breath. 11/22/21   Talbot Grumbling, FNP  cetirizine (ZYRTEC) 10 MG tablet Take 1 tablet (10 mg total) by mouth daily. 07/13/21   Scot Jun, FNP  Guaifenesin 1200 MG TB12 Take 1 tablet (1,200 mg total) by mouth in the morning and at bedtime. 11/22/21   Talbot Grumbling, FNP  pantoprazole (PROTONIX) 20 MG tablet Take 1 tablet (20 mg total) by mouth daily. 08/24/21   Carvel Getting, NP  phenazopyridine (PYRIDIUM) 200 MG tablet Take 1 tablet (200 mg total) by mouth 3 (three) times daily as needed for pain. 12/06/21 12/06/22  Fransico Meadow, PA-C    Physical Exam: Vitals:   02/23/22 0445 02/23/22 0500 02/23/22 0556 02/23/22 0759  BP: 120/77  132/74 101/72  Pulse:   (!) 106 100  Resp: (!) 23 (!) 21 20 (!)  21  Temp:   (!) 100.7 F (38.2 C) 100.1 F (37.8 C)  TempSrc:   Axillary Oral  SpO2:   99% 96%  Weight:      Height:       Exam  Constitutional: Middle-aged female currently in no acute distress Eyes: PERRL, lids normal.  Mild scleral icterus ENMT: Mucous membranes are dry.  Posterior pharynx clear of any exudate or lesions.  Neck: normal, supple  Respiratory: Normal respiratory effort without significant wheezes or rhonchi at this time. Cardiovascular: Regular rate and rhythm, no murmurs / rubs / gallops. No extremity edema. 2+ pedal pulses. No  carotid bruits.  Abdomen: No significant abdominal pain or CVA tenderness appreciated at this time.  Bowel sounds present all 4 quadrants Musculoskeletal: no clubbing / cyanosis. No joint deformity upper and lower extremities. Good ROM, no contractures. Normal muscle tone.  Skin: no rashes, lesions, ulcers. No induration Neurologic: CN 2-12 grossly intact.   Strength 5/5 in all 4.  Psychiatric: Normal judgment and insight. Alert and oriented x 3. Normal mood.   Data Reviewed:  repeat labs noted WBC 19.4, hemoglobin 10, platelet count 149, phosphatase 159, AST 147, ALT 211, and total bilirubin 2.6. Assessment and Plan:  Sepsis secondary to bilateral pyelonephritis Patient presented with fever up to 102.3 F with tachycardia, tachypnea, and white blood cell count elevated at 15.5 to meet SIRS criteria.  Urinalysis significant for small hemoglobin, large leukocytes, many bacteria, 0-5 RBCs/hpf, and greater than 50 RBC/hpf. CT scan of the abdomen pelvis noted bilateral pyelonephritis.  Blood and urine cultures have been obtained and patient had been started on empiric antibiotics of Rocephin. -Admit to MedSurg bed -Follow-up blood and urine cultures -Continue empiric antibiotics of Rocephin -Oxycodone/morphine IV as needed for moderate to severe pain respectively -Tylenol as needed for fever -Recheck CBC tomorrow morning  Microcytic hypochromic anemia Acute.  Hemoglobin 11.3->10 with low MCV and MCH.  Baseline hemoglobin had been within normal limits at 12 when checked last in 07/2021.  Patient denies any complaints of bleeding -Continue to monitor H&H  Transaminitis Hyperbilirubinemia Acute.   Labs since 1/10 significant for alkaline phosphatase 181->159, AST 188->147, ALT 257,->211 and total bilirubin 2.1->2.6. Right upper quadrant ultrasound was within normal meds.  -Check hepatitis panel  Hypokalemia Acute.  Potassium 3.1. -Give potassium chloride 40 mEq p.o. -Continue to monitor and  replace as needed  Hyponatremia Acute.  Sodium 132->133.  Suspect hypovolemic hyponatremia given reports of poor p.o. intake. -Continue to monitor  Tobacco abuse Patient reports smoking 2 packs cigarettes per day on average. -Counseled on the need of cessation of tobacco use -Nicotine patch offered  Dvt ppx: Lovenox Advance Care Planning:   Code Status: Full Code  Consults: None  Family Communication: None requested  Severity of Illness: The appropriate patient status for this patient is INPATIENT. Inpatient status is judged to be reasonable and necessary in order to provide the required intensity of service to ensure the patient's safety. The patient's presenting symptoms, physical exam findings, and initial radiographic and laboratory data in the context of their chronic comorbidities is felt to place them at high risk for further clinical deterioration. Furthermore, it is not anticipated that the patient will be medically stable for discharge from the hospital within 2 midnights of admission.   * I certify that at the point of admission it is my clinical judgment that the patient will require inpatient hospital care spanning beyond 2 midnights from the point of admission due to high  intensity of service, high risk for further deterioration and high frequency of surveillance required.*  Author: Clydie Braun, MD 02/23/2022 8:42 AM  For on call review [email or web address].

## 2022-02-24 DIAGNOSIS — N12 Tubulo-interstitial nephritis, not specified as acute or chronic: Secondary | ICD-10-CM | POA: Diagnosis not present

## 2022-02-24 LAB — COMPREHENSIVE METABOLIC PANEL
ALT: 174 U/L — ABNORMAL HIGH (ref 0–44)
AST: 113 U/L — ABNORMAL HIGH (ref 15–41)
Albumin: 2.2 g/dL — ABNORMAL LOW (ref 3.5–5.0)
Alkaline Phosphatase: 158 U/L — ABNORMAL HIGH (ref 38–126)
Anion gap: 11 (ref 5–15)
BUN: <5 mg/dL — ABNORMAL LOW (ref 6–20)
CO2: 24 mmol/L (ref 22–32)
Calcium: 8.4 mg/dL — ABNORMAL LOW (ref 8.9–10.3)
Chloride: 99 mmol/L (ref 98–111)
Creatinine, Ser: 0.78 mg/dL (ref 0.44–1.00)
GFR, Estimated: >60 mL/min (ref >60)
Glucose, Bld: 118 mg/dL — ABNORMAL HIGH (ref 70–99)
Potassium: 3.5 mmol/L (ref 3.5–5.1)
Sodium: 134 mmol/L — ABNORMAL LOW (ref 135–145)
Total Bilirubin: 1.9 mg/dL — ABNORMAL HIGH (ref 0.3–1.2)
Total Protein: 5.9 g/dL — ABNORMAL LOW (ref 6.5–8.1)

## 2022-02-24 LAB — MISC LABCORP TEST (SEND OUT): Labcorp test code: 6510

## 2022-02-24 LAB — CBC
HCT: 24.6 % — ABNORMAL LOW (ref 36.0–46.0)
Hemoglobin: 8.4 g/dL — ABNORMAL LOW (ref 12.0–15.0)
MCH: 24.8 pg — ABNORMAL LOW (ref 26.0–34.0)
MCHC: 34.1 g/dL (ref 30.0–36.0)
MCV: 72.6 fL — ABNORMAL LOW (ref 80.0–100.0)
Platelets: 146 10*3/uL — ABNORMAL LOW (ref 150–400)
RBC: 3.39 MIL/uL — ABNORMAL LOW (ref 3.87–5.11)
RDW: 12.6 % (ref 11.5–15.5)
WBC: 16.8 10*3/uL — ABNORMAL HIGH (ref 4.0–10.5)
nRBC: 0 % (ref 0.0–0.2)

## 2022-02-24 LAB — URINE CULTURE: Culture: >=100000 — AB

## 2022-02-24 LAB — RETICULOCYTES
Immature Retic Fract: 31.1 % — ABNORMAL HIGH (ref 2.3–15.9)
RBC.: 3.79 MIL/uL — ABNORMAL LOW (ref 3.87–5.11)
Retic Count, Absolute: 57.2 10*3/uL (ref 19.0–186.0)
Retic Ct Pct: 1.5 % (ref 0.4–3.1)

## 2022-02-24 LAB — IRON AND TIBC
Iron: 41 ug/dL (ref 28–170)
Saturation Ratios: 21 % (ref 10.4–31.8)
TIBC: 193 ug/dL — ABNORMAL LOW (ref 250–450)
UIBC: 152 ug/dL

## 2022-02-24 LAB — FERRITIN: Ferritin: 1967 ng/mL — ABNORMAL HIGH (ref 11–307)

## 2022-02-24 LAB — HIV ANTIBODY (ROUTINE TESTING W REFLEX): HIV Screen 4th Generation wRfx: NONREACTIVE

## 2022-02-24 LAB — FOLATE: Folate: 13.6 ng/mL (ref >5.9)

## 2022-02-24 LAB — VITAMIN B12: Vitamin B-12: 320 pg/mL (ref 180–914)

## 2022-02-24 LAB — MAGNESIUM: Magnesium: 1.6 mg/dL — ABNORMAL LOW (ref 1.7–2.4)

## 2022-02-24 MED ORDER — MORPHINE SULFATE (PF) 2 MG/ML IV SOLN: 2.0000 mg | Freq: Three times a day (TID) | INTRAVENOUS | Status: DC | PRN

## 2022-02-24 NOTE — Progress Notes (Signed)
PROGRESS NOTE    Sherry Leonard  IHK:[id number] DOB: May 13, 1970 DOA: 02/22/2022 PCP: [facility]   Brief Narrative:  Sherry Leonard is a 52 y.o. female with medical history significant of depression, narcolepsy with cataplexy, and GERD who presents with complaints of pain and burning with urination over the 2 weeks.  She states that she was trying to treat her UTI at home as she does not currently have PCP she follows up with.  Notably worsening pain with ambulation over the past 24 hours and ultimately presented to outside facility for evaluation and treatment.  Given abnormal UA, patient meeting sepsis criteria with CT consistent with pyelonephritis bilaterally patient was admitted to the hospital under hospitalist services.  Assessment & Plan:   Principal Problem:   Pyelonephritis Active Problems:   Sepsis (HCC)   Transaminitis   Microcytic hypochromic anemia   Hyperbilirubinemia   Hypokalemia   Tobacco use disorder, severe, dependence   Hyponatremia  Sepsis secondary to bilateral pyelonephritis - Leukocytosis, febrile with tachycardia and tachypnea, obvious source of bilateral pyelonephritis and UTI -Continue empiric antibiotics of Rocephin - follow cultures -De-escalate narcotics, discontinue IV morphine transition to p.o. oxycodone.  We discussed with the patient this would likely be weaned down in the next 24 to 48 hours.   Microcytic hypochromic anemia -Acute on chronic -Likely hemodilutional overnight given downtrending CBC -No signs or symptoms of bleeding. -Follow iron panel No results found for: "IRON", "TIBC", "FERRITIN", "IRONPCTSAT"   Transaminitis, resolving Hyperbilirubinemia -Labs downtrending appropriately with fluid resuscitation, likely hemoconcentration versus septic in origin  -Right upper quadrant ultrasound and CT unremarkable for any overt findings to explain lab abnormalities  -Hepatitis C testing negative  Hypovolemic hyponatremia   Hypokalemia -Secondary to poor p.o. intake -Continue to follow replete as appropriate -Mild hyponatremia, continue IV fluids and increase p.o. intake   Tobacco abuse Patient reports smoking 2 packs cigarettes per day on average. Cessation counseling at bedside  DVT prophylaxis: Lovenox Code Status: Full Family Communication: None present  Status is: Inpatient  Dispo: The patient is from: Home              Anticipated d/c is to: Home              Anticipated d/c date is: 24 to 48 hours              Patient currently not medically stable for discharge given ongoing need for IV antibiotics IV fluids and pain medications in the setting of sepsis pyelonephritis and multiple lab abnormalities  Consultants:  None  Procedures:  None  Antimicrobials:  Ceftriaxone  Subjective: No acute issues or events overnight, abdominal pain and loose stool improving but not yet resolved otherwise denies nausea vomiting constipation any fevers chills or chest pain.  Objective: Vitals:   02/23/22 1537 02/23/22 1951 02/24/22 0319 02/24/22 0400  BP: 103/68 117/77 99/61   Pulse: 92 (!) 110 (!) 102   Resp: 19     Temp: 98.4 F (36.9 C) 99.8 F (37.7 C) 98.6 F (37 C) 100.3 F (37.9 C)  TempSrc: Oral Oral Oral Oral  SpO2: 99% 93% 98%   Weight:      Height:        Intake/Output Summary (Last 24 hours) at 02/24/2022 0732 Last data filed at 02/23/2022 1521 Gross per 24 hour  Intake 120 ml  Output --  Net 120 ml   Filed Weights   02/22/22 2132  Weight: 77.1 kg    Examination:  General  exam: Appears calm and comfortable  Respiratory system: Clear to auscultation. Respiratory effort normal. Cardiovascular system: S1 & S2 heard, RRR. No JVD, murmurs, rubs, gallops or clicks. No pedal edema. Gastrointestinal system: Abdomen is nondistended, soft and nontender. No organomegaly or masses felt. Normal bowel sounds heard. Central nervous system: Alert and oriented. No focal neurological  deficits. Extremities: Symmetric 5 x 5 power. Skin: No rashes, lesions or ulcers Psychiatry: Judgement and insight appear normal. Mood & affect appropriate.     Data Reviewed: I have personally reviewed following labs and imaging studies  CBC: Recent Labs  Lab 02/22/22 2136 02/23/22 0914 02/24/22 0441  WBC 15.5* 19.4* 16.8*  HGB 11.3* 10.0* 8.4*  HCT 33.5* 28.2* 24.6*  MCV 73.3* 71.6* 72.6*  PLT 183 149* [id number]*   Basic Metabolic Panel: Recent Labs  Lab 02/22/22 2136 02/23/22 0914 02/24/22 0441  NA 132* 133* 134*  K 3.6 3.1* 3.5  CL 95* 97* 99  CO2 26 26 24   GLUCOSE 162* 121* 118*  BUN 10 <5* <5*  CREATININE 0.91 0.94 0.78  CALCIUM 10.3 8.9 8.4*  MG  --   --  1.6*   GFR: Estimated Creatinine Clearance: 88.2 mL/min (by C-G formula based on SCr of 0.78 mg/dL). Liver Function Tests: Recent Labs  Lab 02/22/22 2136 02/23/22 0914 02/24/22 0441  AST 188* 147* 113*  ALT 257* 211* 174*  ALKPHOS 181* 159* 158*  BILITOT 2.1* 2.6* 1.9*  PROT 8.4* 6.7 5.9*  ALBUMIN 3.9 2.5* 2.2*   Recent Labs  Lab 02/22/22 2136  LIPASE 14   No results for input(s): "AMMONIA" in the last 168 hours. Coagulation Profile: Recent Labs  Lab 02/23/22 0157  INR 1.1   Cardiac Enzymes: No results for input(s): "CKTOTAL", "CKMB", "CKMBINDEX", "TROPONINI" in the last 168 hours. BNP (last 3 results) No results for input(s): "PROBNP" in the last 8760 hours. HbA1C: No results for input(s): "HGBA1C" in the last 72 hours. CBG: No results for input(s): "GLUCAP" in the last 168 hours. Lipid Profile: No results for input(s): "CHOL", "HDL", "LDLCALC", "TRIG", "CHOLHDL", "LDLDIRECT" in the last 72 hours. Thyroid Function Tests: No results for input(s): "TSH", "T4TOTAL", "FREET4", "T3FREE", "THYROIDAB" in the last 72 hours. Anemia Panel: No results for input(s): "VITAMINB12", "FOLATE", "FERRITIN", "TIBC", "IRON", "RETICCTPCT" in the last 72 hours. Sepsis Labs: Recent Labs  Lab 02/23/22 0157  02/23/22 0624  LATICACIDVEN 1.3 1.5    Recent Results (from the past 240 hour(s))  Urine Culture     Status: Abnormal   Collection Time: 02/22/22  6:45 PM   Specimen: Urine, Clean Catch  Result Value Ref Range Status   Specimen Description URINE, CLEAN CATCH  Final   Special Requests   Final    NONE Performed at [facility], [employer] [address], [place], [place] [address]    Culture >=100,000 COLONIES/mL ESCHERICHIA COLI (A)  Final   Report Status 02/24/2022 FINAL  Final   Organism ID, Bacteria ESCHERICHIA COLI (A)  Final      Susceptibility   Escherichia coli - MIC*    AMPICILLIN <=2 SENSITIVE Sensitive     CEFAZOLIN <=4 SENSITIVE Sensitive     CEFEPIME <=0.12 SENSITIVE Sensitive     CEFTRIAXONE <=0.25 SENSITIVE Sensitive     CIPROFLOXACIN <=0.25 SENSITIVE Sensitive     GENTAMICIN <=1 SENSITIVE Sensitive     IMIPENEM <=0.25 SENSITIVE Sensitive     NITROFURANTOIN <=16 SENSITIVE Sensitive     TRIMETH/SULFA <=20 SENSITIVE Sensitive     AMPICILLIN/SULBACTAM <=2  SENSITIVE Sensitive     PIP/TAZO <=4 SENSITIVE Sensitive     * >=100,000 COLONIES/mL ESCHERICHIA COLI  Resp panel by RT-PCR (RSV, Flu A&B, Covid) Anterior Nasal Swab     Status: None   Collection Time: 02/22/22  9:36 PM   Specimen: Anterior Nasal Swab  Result Value Ref Range Status   SARS Coronavirus 2 by RT PCR NEGATIVE NEGATIVE Final    Comment: (NOTE) SARS-CoV-2 target nucleic acids are NOT DETECTED.  The SARS-CoV-2 RNA is generally detectable in upper respiratory specimens during the acute phase of infection. The lowest concentration of SARS-CoV-2 viral copies this assay can detect is 138 copies/mL. A negative result does not preclude SARS-Cov-2 infection and should not be used as the sole basis for treatment or other patient management decisions. A negative result may occur with  improper specimen collection/handling, submission of specimen other than nasopharyngeal swab, presence of viral mutation(s) within  the areas targeted by this assay, and inadequate number of viral copies(<138 copies/mL). A negative result must be combined with clinical observations, patient history, and epidemiological information. The expected result is Negative.  Fact Sheet for Patients:  EntrepreneurPulse.com.au  Fact Sheet for Healthcare Providers:  IncredibleEmployment.be  This test is no t yet approved or cleared by the [place] FDA and  has been authorized for detection and/or diagnosis of SARS-CoV-2 by FDA under an Emergency Use Authorization (EUA). This EUA will remain  in effect (meaning this test can be used) for the duration of the COVID-19 declaration under Section 564(b)(1) of the Act, 21 U.S.C.section 360bbb-3(b)(1), unless the authorization is terminated  or revoked sooner.       Influenza A by PCR NEGATIVE NEGATIVE Final   Influenza B by PCR NEGATIVE NEGATIVE Final    Comment: (NOTE) The Xpert Xpress SARS-CoV-2/FLU/RSV plus assay is intended as an aid in the diagnosis of influenza from Nasopharyngeal swab specimens and should not be used as a sole basis for treatment. Nasal washings and aspirates are unacceptable for Xpert Xpress SARS-CoV-2/FLU/RSV testing.  Fact Sheet for Patients: EntrepreneurPulse.com.au  Fact Sheet for Healthcare Providers: IncredibleEmployment.be  This test is not yet approved or cleared by the [place] FDA and has been authorized for detection and/or diagnosis of SARS-CoV-2 by FDA under an Emergency Use Authorization (EUA). This EUA will remain in effect (meaning this test can be used) for the duration of the COVID-19 declaration under Section 564(b)(1) of the Act, 21 U.S.C. section 360bbb-3(b)(1), unless the authorization is terminated or revoked.     Resp Syncytial Virus by PCR NEGATIVE NEGATIVE Final    Comment: (NOTE) Fact Sheet for  Patients: EntrepreneurPulse.com.au  Fact Sheet for Healthcare Providers: IncredibleEmployment.be  This test is not yet approved or cleared by the [place] FDA and has been authorized for detection and/or diagnosis of SARS-CoV-2 by FDA under an Emergency Use Authorization (EUA). This EUA will remain in effect (meaning this test can be used) for the duration of the COVID-19 declaration under Section 564(b)(1) of the Act, 21 U.S.C. section 360bbb-3(b)(1), unless the authorization is terminated or revoked.  Performed at [facility], [address] [place], [place] [address]          Radiology Studies: CT ABDOMEN PELVIS W CONTRAST  Result Date: 02/23/2022 CLINICAL DATA:  Lower abdominal pain. EXAM: CT ABDOMEN AND PELVIS WITH CONTRAST TECHNIQUE: Multidetector CT imaging of the abdomen and pelvis was performed using the standard protocol following bolus administration of intravenous contrast. RADIATION DOSE REDUCTION: This exam was performed according  to the departmental dose-optimization program which includes automated exposure control, adjustment of the mA and/or kV according to patient size and/or use of iterative reconstruction technique. CONTRAST:  1105mL OMNIPAQUE IOHEXOL 300 MG/ML  SOLN COMPARISON:  01/22/2014 FINDINGS: Lower chest: No acute abnormality. Hepatobiliary: No focal liver abnormality is seen. No gallstones, gallbladder wall thickening, or biliary dilatation. Pancreas: Unremarkable. No pancreatic ductal dilatation or surrounding inflammatory changes. Spleen: Normal in size without focal abnormality. Adrenals/Urinary Tract: Normal adrenal glands. Geographic cortical hypoattenuation in both kidneys compatible with pyelonephritis. Prominent left-greater-than-right renal pelvises with urothelial thickening and hyperenhancement. No obstructing urinary calculi. Unremarkable bladder. Stomach/Bowel: Stomach is within normal  limits. Appendix appears normal. No evidence of bowel wall thickening, distention, or inflammatory changes. Vascular/Lymphatic: Aortic atherosclerosis. No enlarged abdominal or pelvic lymph nodes. Reproductive: Uterus and bilateral adnexa are unremarkable. Other: No free intraperitoneal fluid or air. Musculoskeletal: No acute abnormality. IMPRESSION: Bilateral pyelonephritis. Electronically Signed   By: Placido Sou M.D.   On: 02/23/2022 01:45   US Abdomen Limited RUQ (LIVER/GB)  Result Date: 02/23/2022 CLINICAL DATA:  Right upper quadrant pain, fever, and body aches EXAM: ULTRASOUND ABDOMEN LIMITED RIGHT UPPER QUADRANT COMPARISON:  CT 01/22/2014 FINDINGS: Gallbladder: No gallstones or wall thickening visualized. No sonographic Murphy sign noted by sonographer. Common bile duct: Diameter: 3.4 Liver: No focal lesion identified. Within normal limits in parenchymal echogenicity. Portal vein is patent on color Doppler imaging with normal direction of blood flow towards the liver. Other: None. IMPRESSION: Unremarkable right upper quadrant ultrasound. Electronically Signed   By: Placido Sou M.D.   On: 02/23/2022 00:27    Scheduled Meds:  enoxaparin (LOVENOX) injection  40 mg Subcutaneous Q24H   fluticasone  2 spray Each Nare Daily   guaiFENesin  600 mg Oral BID   nicotine  21 mg Transdermal Daily   pantoprazole  20 mg Oral Daily   sodium chloride flush  3 mL Intravenous Q12H   Continuous Infusions:  cefTRIAXone (ROCEPHIN)  IV 2 g (02/23/22 2219)     LOS: 1 day   Time spent: 25min  Chasin Findling C Sharese Manrique, DO [facility]  If 7PM-7AM, please contact night-coverage [email or web address]  02/24/2022, 7:32 AM

## 2022-02-25 DIAGNOSIS — N12 Tubulo-interstitial nephritis, not specified as acute or chronic: Secondary | ICD-10-CM | POA: Diagnosis not present

## 2022-02-25 LAB — URINE CULTURE: Culture: >=100000 — AB

## 2022-02-25 MED ORDER — CEFADROXIL 500 MG PO CAPS: 1000.0000 mg | ORAL_CAPSULE | Freq: Two times a day (BID) | ORAL | Status: DC

## 2022-02-25 NOTE — Progress Notes (Signed)
PROGRESS NOTE    Sherry Leonard  WUX:[id number] DOB: 01/08/1971 DOA: 02/22/2022 PCP: [facility]   Brief Narrative:  Sherry Leonard is a 52 y.o. female with medical history significant of depression, narcolepsy with cataplexy, and GERD who presents with complaints of pain and burning with urination over the 2 weeks.  She states that she was trying to treat her UTI at home as she does not currently have PCP she follows up with.  Notably worsening pain with ambulation over the past 24 hours and ultimately presented to outside facility for evaluation and treatment.  Given abnormal UA, patient meeting sepsis criteria with CT consistent with pyelonephritis bilaterally patient was admitted to the hospital under hospitalist services.  Assessment & Plan:   Principal Problem:   Pyelonephritis Active Problems:   Sepsis (HCC)   Transaminitis   Microcytic hypochromic anemia   Hyperbilirubinemia   Hypokalemia   Tobacco use disorder, severe, dependence   Hyponatremia  Sepsis secondary to bilateral pyelonephritis - Leukocytosis, febrile with tachycardia and tachypnea, obvious source of bilateral pyelonephritis and UTI -improving but not yet resolved -Continue empiric antibiotics of Rocephin - follow cultures -Narcotics discontinued, pain well-controlled on Tylenol at this time   Microcytic hypochromic anemia -Acute on chronic -Likely hemodilutional overnight given downtrending CBC -No signs or symptoms of bleeding. -Iron panel consistent with acute illness (elevated ferritin) mildly low TIBC but otherwise WNL -B12/folate WNL   Transaminitis, resolving Hyperbilirubinemia -Labs downtrending appropriately with fluid resuscitation, likely hemoconcentration versus septic in origin  -Right upper quadrant ultrasound and CT unremarkable for any overt findings to explain lab abnormalities  -Hepatitis C testing negative  Hypovolemic hyponatremia  Hypokalemia -Secondary to poor p.o.  intake -Continue to follow replete as appropriate -Mild hyponatremia, continue IV fluids and increase p.o. intake   Tobacco abuse Patient reports smoking 2 packs cigarettes per day on average. Cessation counseling at bedside  DVT prophylaxis: Lovenox Code Status: Full Family Communication: None present  Status is: Inpatient  Dispo: The patient is from: Home              Anticipated d/c is to: Home              Anticipated d/c date is: 24h pending resolution of sepsis criteria and improvement in symptoms              Patient currently not medically stable for discharge given ongoing need for IV antibiotics IV fluids and pain medications in the setting of sepsis pyelonephritis and multiple lab abnormalities  Consultants:  None  Procedures:  None  Antimicrobials:  Ceftriaxone  Subjective: No acute issues or events overnight, abdominal pain and loose stool improving but not yet resolved otherwise denies nausea vomiting constipation any fevers chills or chest pain.  Objective: Vitals:   02/24/22 0840 02/24/22 1749 02/24/22 1953 02/25/22 0439  BP: 107/66 109/66 108/63 99/64  Pulse: (!) 101 94 98 95  Resp: 18 18 18 17   Temp: 98.4 F (36.9 C) 97.7 F (36.5 C) 98.6 F (37 C) 97.8 F (36.6 C)  TempSrc: Oral Oral Oral Oral  SpO2: 98% 99% 98% 97%  Weight:      Height:       No intake or output data in the 24 hours ending 02/25/22 0759  Filed Weights   02/22/22 2132  Weight: 77.1 kg    Examination:  General exam: Appears calm and comfortable  Respiratory system: Clear to auscultation. Respiratory effort normal. Cardiovascular system: S1 & S2 heard, RRR. No JVD,  murmurs, rubs, gallops or clicks. No pedal edema. Gastrointestinal system: Abdomen is nondistended, soft and nontender. No organomegaly or masses felt. Normal bowel sounds heard. Central nervous system: Alert and oriented. No focal neurological deficits. Extremities: Symmetric 5 x 5 power. Skin: No rashes,  lesions or ulcers Psychiatry: Judgement and insight appear normal. Mood & affect appropriate.     Data Reviewed: I have personally reviewed following labs and imaging studies  CBC: Recent Labs  Lab 02/22/22 2136 02/23/22 0914 02/24/22 0441  WBC 15.5* 19.4* 16.8*  HGB 11.3* 10.0* 8.4*  HCT 33.5* 28.2* 24.6*  MCV 73.3* 71.6* 72.6*  PLT 183 149* 146*    Basic Metabolic Panel: Recent Labs  Lab 02/22/22 2136 02/23/22 0914 02/24/22 0441  NA 132* 133* 134*  K 3.6 3.1* 3.5  CL 95* 97* 99  CO2 26 26 24   GLUCOSE 162* 121* 118*  BUN 10 <5* <5*  CREATININE 0.91 0.94 0.78  CALCIUM 10.3 8.9 8.4*  MG  --   --  1.6*    GFR: Estimated Creatinine Clearance: 88.2 mL/min (by C-G formula based on SCr of 0.78 mg/dL). Liver Function Tests: Recent Labs  Lab 02/22/22 2136 02/23/22 0914 02/24/22 0441  AST 188* 147* 113*  ALT 257* 211* 174*  ALKPHOS 181* 159* 158*  BILITOT 2.1* 2.6* 1.9*  PROT 8.4* 6.7 5.9*  ALBUMIN 3.9 2.5* 2.2*    Recent Labs  Lab 02/22/22 2136  LIPASE 14    No results for input(s): "AMMONIA" in the last 168 hours. Coagulation Profile: Recent Labs  Lab 02/23/22 0157  INR 1.1    Cardiac Enzymes: No results for input(s): "CKTOTAL", "CKMB", "CKMBINDEX", "TROPONINI" in the last 168 hours. BNP (last 3 results) No results for input(s): "PROBNP" in the last 8760 hours. HbA1C: No results for input(s): "HGBA1C" in the last 72 hours. CBG: No results for input(s): "GLUCAP" in the last 168 hours. Lipid Profile: No results for input(s): "CHOL", "HDL", "LDLCALC", "TRIG", "CHOLHDL", "LDLDIRECT" in the last 72 hours. Thyroid Function Tests: No results for input(s): "TSH", "T4TOTAL", "FREET4", "T3FREE", "THYROIDAB" in the last 72 hours. Anemia Panel: Recent Labs    02/24/22 1301  VITAMINB12 320  FOLATE 13.6  FERRITIN 1,967*  TIBC 193*  IRON 41  RETICCTPCT 1.5   Sepsis Labs: Recent Labs  Lab 02/23/22 0157 02/23/22 0624  LATICACIDVEN 1.3 1.5      Recent Results (from the past 240 hour(s))  Urine Culture     Status: Abnormal   Collection Time: 02/22/22  6:45 PM   Specimen: Urine, Clean Catch  Result Value Ref Range Status   Specimen Description URINE, CLEAN CATCH  Final   Special Requests   Final    NONE Performed at [facility], [place] [address] [place], [place] [address]    Culture >=100,000 COLONIES/mL ESCHERICHIA COLI (A)  Final   Report Status 02/24/2022 FINAL  Final   Organism ID, Bacteria ESCHERICHIA COLI (A)  Final      Susceptibility   Escherichia coli - MIC*    AMPICILLIN <=2 SENSITIVE Sensitive     CEFAZOLIN <=4 SENSITIVE Sensitive     CEFEPIME <=0.12 SENSITIVE Sensitive     CEFTRIAXONE <=0.25 SENSITIVE Sensitive     CIPROFLOXACIN <=0.25 SENSITIVE Sensitive     GENTAMICIN <=1 SENSITIVE Sensitive     IMIPENEM <=0.25 SENSITIVE Sensitive     NITROFURANTOIN <=16 SENSITIVE Sensitive     TRIMETH/SULFA <=20 SENSITIVE Sensitive     AMPICILLIN/SULBACTAM <=2 SENSITIVE Sensitive  PIP/TAZO <=4 SENSITIVE Sensitive     * >=100,000 COLONIES/mL ESCHERICHIA COLI  Resp panel by RT-PCR (RSV, Flu A&B, Covid) Anterior Nasal Swab     Status: None   Collection Time: 02/22/22  9:36 PM   Specimen: Anterior Nasal Swab  Result Value Ref Range Status   SARS Coronavirus 2 by RT PCR NEGATIVE NEGATIVE Final    Comment: (NOTE) SARS-CoV-2 target nucleic acids are NOT DETECTED.  The SARS-CoV-2 RNA is generally detectable in upper respiratory specimens during the acute phase of infection. The lowest concentration of SARS-CoV-2 viral copies this assay can detect is 138 copies/mL. A negative result does not preclude SARS-Cov-2 infection and should not be used as the sole basis for treatment or other patient management decisions. A negative result may occur with  improper specimen collection/handling, submission of specimen other than nasopharyngeal swab, presence of viral mutation(s) within the areas targeted by this assay,  and inadequate number of viral copies(<138 copies/mL). A negative result must be combined with clinical observations, patient history, and epidemiological information. The expected result is Negative.  Fact Sheet for Patients:  BloggerCourse.com  Fact Sheet for Healthcare Providers:  SeriousBroker.it  This test is no t yet approved or cleared by the [place] FDA and  has been authorized for detection and/or diagnosis of SARS-CoV-2 by FDA under an Emergency Use Authorization (EUA). This EUA will remain  in effect (meaning this test can be used) for the duration of the COVID-19 declaration under Section 564(b)(1) of the Act, 21 U.S.C.section 360bbb-3(b)(1), unless the authorization is terminated  or revoked sooner.       Influenza A by PCR NEGATIVE NEGATIVE Final   Influenza B by PCR NEGATIVE NEGATIVE Final    Comment: (NOTE) The Xpert Xpress SARS-CoV-2/FLU/RSV plus assay is intended as an aid in the diagnosis of influenza from Nasopharyngeal swab specimens and should not be used as a sole basis for treatment. Nasal washings and aspirates are unacceptable for Xpert Xpress SARS-CoV-2/FLU/RSV testing.  Fact Sheet for Patients: BloggerCourse.com  Fact Sheet for Healthcare Providers: SeriousBroker.it  This test is not yet approved or cleared by the [place] FDA and has been authorized for detection and/or diagnosis of SARS-CoV-2 by FDA under an Emergency Use Authorization (EUA). This EUA will remain in effect (meaning this test can be used) for the duration of the COVID-19 declaration under Section 564(b)(1) of the Act, 21 U.S.C. section 360bbb-3(b)(1), unless the authorization is terminated or revoked.     Resp Syncytial Virus by PCR NEGATIVE NEGATIVE Final    Comment: (NOTE) Fact Sheet for Patients: BloggerCourse.com  Fact Sheet for  Healthcare Providers: SeriousBroker.it  This test is not yet approved or cleared by the [place] FDA and has been authorized for detection and/or diagnosis of SARS-CoV-2 by FDA under an Emergency Use Authorization (EUA). This EUA will remain in effect (meaning this test can be used) for the duration of the COVID-19 declaration under Section 564(b)(1) of the Act, 21 U.S.C. section 360bbb-3(b)(1), unless the authorization is terminated or revoked.  Performed at [facility], [address], [place], [place] [address]   Urine Culture     Status: Abnormal (Preliminary result)   Collection Time: 02/22/22 11:35 PM   Specimen: Urine, Clean Catch  Result Value Ref Range Status   Specimen Description   Final    URINE, CLEAN CATCH Performed at [facility], [address], [place], [place] [address]    Special Requests   Final    NONE  Performed at [facility], [address] [place], [place] [address]    Culture (A)  Final    >=100,000 COLONIES/mL ESCHERICHIA COLI CULTURE REINCUBATED FOR BETTER GROWTH SUSCEPTIBILITIES TO FOLLOW Performed at [facility] [address], [place], [place] [address]    Report Status PENDING  Incomplete  Culture, blood (routine x 2)     Status: None (Preliminary result)   Collection Time: 02/23/22  2:02 AM   Specimen: BLOOD  Result Value Ref Range Status   Specimen Description   Final    BLOOD Performed at [facility], [address], [place], [place] [address]    Special Requests   Final    NONE Performed at [facility], [address], [place], [place] [address]    Culture   Final    NO GROWTH 2 DAYS Performed at [facility], [place] [address] [place], [place] [address]    Report Status PENDING  Incomplete  Culture, blood (routine x 2)     Status: None (Preliminary result)   Collection Time: 02/23/22   2:17 AM   Specimen: BLOOD  Result Value Ref Range Status   Specimen Description   Final    BLOOD Performed at [facility], [address] [place], [place] [address]    Special Requests   Final    NONE Performed at [facility], [address], [place], [place] [address]    Culture   Final    NO GROWTH 2 DAYS Performed at [facility], [place] [address], [place], [place] [address]    Report Status PENDING  Incomplete         Radiology Studies: No results found.  Scheduled Meds:  enoxaparin (LOVENOX) injection  40 mg Subcutaneous Q24H   fluticasone  2 spray Each Nare Daily   guaiFENesin  600 mg Oral BID   nicotine  21 mg Transdermal Daily   pantoprazole  20 mg Oral Daily   sodium chloride flush  3 mL Intravenous Q12H   Continuous Infusions:  cefTRIAXone (ROCEPHIN)  IV 2 g (02/24/22 2226)     LOS: 2 days   Time spent: 66min  Susan Bleich C Tyller Bowlby, DO [facility]  If 7PM-7AM, please contact night-coverage [email or web address]  02/25/2022, 7:59 AM

## 2022-02-26 DIAGNOSIS — N12 Tubulo-interstitial nephritis, not specified as acute or chronic: Secondary | ICD-10-CM | POA: Diagnosis not present

## 2022-02-26 LAB — COMPREHENSIVE METABOLIC PANEL
ALT: 103 U/L — ABNORMAL HIGH (ref 0–44)
AST: 45 U/L — ABNORMAL HIGH (ref 15–41)
Albumin: 2.5 g/dL — ABNORMAL LOW (ref 3.5–5.0)
Alkaline Phosphatase: 152 U/L — ABNORMAL HIGH (ref 38–126)
Anion gap: 13 (ref 5–15)
BUN: <5 mg/dL — ABNORMAL LOW (ref 6–20)
CO2: 22 mmol/L (ref 22–32)
Calcium: 8.8 mg/dL — ABNORMAL LOW (ref 8.9–10.3)
Chloride: 101 mmol/L (ref 98–111)
Creatinine, Ser: 0.74 mg/dL (ref 0.44–1.00)
GFR, Estimated: >60 mL/min (ref >60)
Glucose, Bld: 132 mg/dL — ABNORMAL HIGH (ref 70–99)
Potassium: 3.4 mmol/L — ABNORMAL LOW (ref 3.5–5.1)
Sodium: 136 mmol/L (ref 135–145)
Total Bilirubin: 0.8 mg/dL (ref 0.3–1.2)
Total Protein: 6.4 g/dL — ABNORMAL LOW (ref 6.5–8.1)

## 2022-02-26 LAB — CBC
HCT: 24.1 % — ABNORMAL LOW (ref 36.0–46.0)
Hemoglobin: 8.3 g/dL — ABNORMAL LOW (ref 12.0–15.0)
MCH: 25.2 pg — ABNORMAL LOW (ref 26.0–34.0)
MCHC: 34.4 g/dL (ref 30.0–36.0)
MCV: 73.3 fL — ABNORMAL LOW (ref 80.0–100.0)
Platelets: 222 10*3/uL (ref 150–400)
RBC: 3.29 MIL/uL — ABNORMAL LOW (ref 3.87–5.11)
RDW: 13.1 % (ref 11.5–15.5)
WBC: 11 10*3/uL — ABNORMAL HIGH (ref 4.0–10.5)
nRBC: 0 % (ref 0.0–0.2)

## 2022-02-26 MED ORDER — CEFADROXIL 500 MG PO CAPS: 1000.0000 mg | ORAL_CAPSULE | Freq: Two times a day (BID) | ORAL | 0 refills | Status: AC

## 2022-02-26 NOTE — Discharge Summary (Signed)
Physician Discharge Summary  [name] OZH:[id number] DOB: 07/29/1970 DOA: 02/22/2022  PCP: [facility]  Admit date: 02/22/2022 Discharge date: 02/26/2022  Admitted From: Home Disposition: Home  Recommendations for Outpatient Follow-up:  Follow up with PCP in 1-2 weeks  Discharge Condition: Stable CODE STATUS: Full Diet recommendation: Regular diet  Brief/Interim Summary: Sherry Leonard is a 52 y.o. female with medical history significant of depression, narcolepsy with cataplexy, and GERD who presents with complaints of pain and burning with urination over the 2 weeks.  She states that she was trying to treat her UTI at home as she does not currently have PCP she follows up with.  Notably worsening pain with ambulation over the past 24 hours and ultimately presented to outside facility for evaluation and treatment.  Given abnormal UA, patient meeting sepsis criteria with CT consistent with pyelonephritis bilaterally patient was admitted to the hospital under hospitalist services.   Patient admitted as above with acute pyelonephritis, bilaterally meeting sepsis criteria at intake.  Patient improved drastically with IV antibiotics, given cultures with de-escalate patient to cefadroxil for the remainder of her course.  Close follow-up with PCP in the next week to ensure resolution of symptoms.  Patient otherwise stable and agreeable for discharge  Discharge Diagnoses:  Principal Problem:   Pyelonephritis Active Problems:   Sepsis ([place])   Transaminitis   Microcytic hypochromic anemia   Hyperbilirubinemia   Hypokalemia   Tobacco use disorder, severe, dependence   Hyponatremia  Sepsis secondary to bilateral pyelonephritis - Leukocytosis, febrile with tachycardia and tachypnea -Continue cefadroxil for 7 days at discharge   Microcytic hypochromic anemia -Acute on chronic -Likely hemodilutional given aggressive IV fluids -No signs or symptoms of bleeding. -Iron panel consistent  with acute illness (elevated ferritin) mildly low TIBC but otherwise WNL -B12/folate WNL   Transaminitis, resolving Hyperbilirubinemia -Labs downtrending appropriately with fluid resuscitation, likely hemoconcentration versus septic in origin  -Repeat labs with PCP in 1 to 2 weeks-Hepatitis C testing negative   Hypovolemic hyponatremia  Hypokalemia -Resolved with increased p.o. intake   Tobacco abuse Patient reports smoking 2 packs cigarettes per day on average. Cessation counseling at bedside   Discharge Instructions   Allergies as of 02/26/2022       Reactions   Aspirin    Itch Able to tolerate Tylenol        Medication List     STOP taking these medications    phenazopyridine 200 MG tablet Commonly known as: Pyridium   Vicks DayQuil Cold & Flu 10-5-325 MG/15ML Liqd Generic drug: DM-Phenylephrine-Acetaminophen       TAKE these medications    acetaminophen 500 MG tablet Commonly known as: TYLENOL Take 1,000 mg by mouth every 6 (six) hours as needed for mild pain or fever.   albuterol 108 (90 Base) MCG/ACT inhaler Commonly known as: VENTOLIN HFA Inhale 1-2 puffs into the lungs every 6 (six) hours as needed for wheezing or shortness of breath.   cefadroxil 500 MG capsule Commonly known as: DURICEF Take 2 capsules (1,000 mg total) by mouth 2 (two) times daily for 7 days.   cetirizine 10 MG tablet Commonly known as: ZYRTEC Take 1 tablet (10 mg total) by mouth daily. What changed:  when to take this reasons to take this   Guaifenesin 1200 MG Tb12 Take 1 tablet (1,200 mg total) by mouth in the morning and at bedtime. What changed:  when to take this reasons to take this   OVER THE COUNTER MEDICATION Take 2 tablets by  mouth every 6 (six) hours as needed (bladder pain). URISTAT   pantoprazole 20 MG tablet Commonly known as: PROTONIX Take 1 tablet (20 mg total) by mouth daily.        Allergies  Allergen Reactions   Aspirin     Itch Able to  tolerate Tylenol    Consultations: None  Procedures/Studies: CT ABDOMEN PELVIS W CONTRAST  Result Date: 02/23/2022 CLINICAL DATA:  Lower abdominal pain. EXAM: CT ABDOMEN AND PELVIS WITH CONTRAST TECHNIQUE: Multidetector CT imaging of the abdomen and pelvis was performed using the standard protocol following bolus administration of intravenous contrast. RADIATION DOSE REDUCTION: This exam was performed according to the departmental dose-optimization program which includes automated exposure control, adjustment of the mA and/or kV according to patient size and/or use of iterative reconstruction technique. CONTRAST:  OMNIPAQUE IOHEXOL 300 MG/ML  SOLN COMPARISON:  01/22/2014 FINDINGS: Lower chest: No acute abnormality. Hepatobiliary: No focal liver abnormality is seen. No gallstones, gallbladder wall thickening, or biliary dilatation. Pancreas: Unremarkable. No pancreatic ductal dilatation or surrounding inflammatory changes. Spleen: Normal in size without focal abnormality. Adrenals/Urinary Tract: Normal adrenal glands. Geographic cortical hypoattenuation in both kidneys compatible with pyelonephritis. Prominent left-greater-than-right renal pelvises with urothelial thickening and hyperenhancement. No obstructing urinary calculi. Unremarkable bladder. Stomach/Bowel: Stomach is within normal limits. Appendix appears normal. No evidence of bowel wall thickening, distention, or inflammatory changes. Vascular/Lymphatic: Aortic atherosclerosis. No enlarged abdominal or pelvic lymph nodes. Reproductive: Uterus and bilateral adnexa are unremarkable. Other: No free intraperitoneal fluid or air. Musculoskeletal: No acute abnormality. IMPRESSION: Bilateral pyelonephritis. Electronically Signed   By: Minerva Fester M.D.   On: 02/23/2022 01:45   US Abdomen Limited RUQ (LIVER/GB)  Result Date: 02/23/2022 CLINICAL DATA:  Right upper quadrant pain, fever, and body aches EXAM: ULTRASOUND ABDOMEN LIMITED RIGHT UPPER  QUADRANT COMPARISON:  CT 01/22/2014 FINDINGS: Gallbladder: No gallstones or wall thickening visualized. No sonographic Murphy sign noted by sonographer. Common bile duct: Diameter: 3.4 Liver: No focal lesion identified. Within normal limits in parenchymal echogenicity. Portal vein is patent on color Doppler imaging with normal direction of blood flow towards the liver. Other: None. IMPRESSION: Unremarkable right upper quadrant ultrasound. Electronically Signed   By: Minerva Fester M.D.   On: 02/23/2022 00:27     Subjective: No acute issues or events overnight   Discharge Exam: Vitals:   02/26/22 0156 02/26/22 0733  BP: 104/62 100/69  Pulse: 74 82  Resp: 18 16  Temp: 98.2 F (36.8 C) 98.3 F (36.8 C)  SpO2: 100% 100%   Vitals:   02/25/22 1617 02/25/22 1941 02/26/22 0156 02/26/22 0733  BP: 102/71 (!) 101/57 104/62 100/69  Pulse: 82 86 74 82  Resp:  18 18 16   Temp: 97.9 F (36.6 C) 97.8 F (36.6 C) 98.2 F (36.8 C) 98.3 F (36.8 C)  TempSrc: Oral Oral  Oral  SpO2: 100% 100% 100% 100%  Weight:      Height:        General: Pt is alert, awake, not in acute distress Cardiovascular: RRR, S1/S2 +, no rubs, no gallops Respiratory: CTA bilaterally, no wheezing, no rhonchi Abdominal: Soft, NT, ND, bowel sounds + Extremities: no edema, no cyanosis    The results of significant diagnostics from this hospitalization (including imaging, microbiology, ancillary and laboratory) are listed below for reference.     Microbiology: Recent Results (from the past 240 hour(s))  Urine Culture     Status: Abnormal   Collection Time: 02/22/22  6:45 PM   Specimen: Urine,  Clean Catch  Result Value Ref Range Status   Specimen Description URINE, CLEAN CATCH  Final   Special Requests   Final    NONE Performed at [facility], [address] [place], [place] [address]    Culture >=100,000 COLONIES/mL ESCHERICHIA COLI (A)  Final   Report Status 02/24/2022 FINAL  Final   Organism ID,  Bacteria ESCHERICHIA COLI (A)  Final      Susceptibility   Escherichia coli - MIC*    AMPICILLIN <=2 SENSITIVE Sensitive     CEFAZOLIN <=4 SENSITIVE Sensitive     CEFEPIME <=0.12 SENSITIVE Sensitive     CEFTRIAXONE <=0.25 SENSITIVE Sensitive     CIPROFLOXACIN <=0.25 SENSITIVE Sensitive     GENTAMICIN <=1 SENSITIVE Sensitive     IMIPENEM <=0.25 SENSITIVE Sensitive     NITROFURANTOIN <=16 SENSITIVE Sensitive     TRIMETH/SULFA <=20 SENSITIVE Sensitive     AMPICILLIN/SULBACTAM <=2 SENSITIVE Sensitive     PIP/TAZO <=4 SENSITIVE Sensitive     * >=100,000 COLONIES/mL ESCHERICHIA COLI  Resp panel by RT-PCR (RSV, Flu A&B, Covid) Anterior Nasal Swab     Status: None   Collection Time: 02/22/22  9:36 PM   Specimen: Anterior Nasal Swab  Result Value Ref Range Status   SARS Coronavirus 2 by RT PCR NEGATIVE NEGATIVE Final    Comment: (NOTE) SARS-CoV-2 target nucleic acids are NOT DETECTED.  The SARS-CoV-2 RNA is generally detectable in upper respiratory specimens during the acute phase of infection. The lowest concentration of SARS-CoV-2 viral copies this assay can detect is 138 copies/mL. A negative result does not preclude SARS-Cov-2 infection and should not be used as the sole basis for treatment or other patient management decisions. A negative result may occur with  improper specimen collection/handling, submission of specimen other than nasopharyngeal swab, presence of viral mutation(s) within the areas targeted by this assay, and inadequate number of viral copies(<138 copies/mL). A negative result must be combined with clinical observations, patient history, and epidemiological information. The expected result is Negative.  Fact Sheet for Patients:  EntrepreneurPulse.com.au  Fact Sheet for Healthcare Providers:  IncredibleEmployment.be  This test is no t yet approved or cleared by the [place] FDA and  has been authorized for detection  and/or diagnosis of SARS-CoV-2 by FDA under an Emergency Use Authorization (EUA). This EUA will remain  in effect (meaning this test can be used) for the duration of the COVID-19 declaration under Section 564(b)(1) of the Act, 21 U.S.C.section 360bbb-3(b)(1), unless the authorization is terminated  or revoked sooner.       Influenza A by PCR NEGATIVE NEGATIVE Final   Influenza B by PCR NEGATIVE NEGATIVE Final    Comment: (NOTE) The Xpert Xpress SARS-CoV-2/FLU/RSV plus assay is intended as an aid in the diagnosis of influenza from Nasopharyngeal swab specimens and should not be used as a sole basis for treatment. Nasal washings and aspirates are unacceptable for Xpert Xpress SARS-CoV-2/FLU/RSV testing.  Fact Sheet for Patients: EntrepreneurPulse.com.au  Fact Sheet for Healthcare Providers: IncredibleEmployment.be  This test is not yet approved or cleared by the [place] FDA and has been authorized for detection and/or diagnosis of SARS-CoV-2 by FDA under an Emergency Use Authorization (EUA). This EUA will remain in effect (meaning this test can be used) for the duration of the COVID-19 declaration under Section 564(b)(1) of the Act, 21 U.S.C. section 360bbb-3(b)(1), unless the authorization is terminated or revoked.     Resp Syncytial Virus by PCR NEGATIVE NEGATIVE Final  Comment: (NOTE) Fact Sheet for Patients: BloggerCourse.com  Fact Sheet for Healthcare Providers: SeriousBroker.it  This test is not yet approved or cleared by the [place] FDA and has been authorized for detection and/or diagnosis of SARS-CoV-2 by FDA under an Emergency Use Authorization (EUA). This EUA will remain in effect (meaning this test can be used) for the duration of the COVID-19 declaration under Section 564(b)(1) of the Act, 21 U.S.C. section 360bbb-3(b)(1), unless the authorization is terminated  or revoked.  Performed at [facility], [address] [place], [place] [address]   Urine Culture     Status: Abnormal   Collection Time: 02/22/22 11:35 PM   Specimen: Urine, Clean Catch  Result Value Ref Range Status   Specimen Description   Final    URINE, CLEAN CATCH Performed at [facility], [address], [place], [place] [address]    Special Requests   Final    NONE Performed at [facility], [address] [place], [place] [address]    Culture (A)  Final    >=100,000 COLONIES/mL ESCHERICHIA COLI Two isolates with different morphologies were identified as the same organism.The most resistant organism was reported. Performed at [facility], [address] [place], [place] [address]    Report Status 02/25/2022 FINAL  Final   Organism ID, Bacteria ESCHERICHIA COLI (A)  Final      Susceptibility   Escherichia coli - MIC*    AMPICILLIN 8 SENSITIVE Sensitive     CEFAZOLIN <=4 SENSITIVE Sensitive     CEFEPIME <=0.12 SENSITIVE Sensitive     CEFTRIAXONE <=0.25 SENSITIVE Sensitive     CIPROFLOXACIN <=0.25 SENSITIVE Sensitive     GENTAMICIN <=1 SENSITIVE Sensitive     IMIPENEM <=0.25 SENSITIVE Sensitive     NITROFURANTOIN <=16 SENSITIVE Sensitive     TRIMETH/SULFA <=20 SENSITIVE Sensitive     AMPICILLIN/SULBACTAM <=2 SENSITIVE Sensitive     PIP/TAZO <=4 SENSITIVE Sensitive     * >=100,000 COLONIES/mL ESCHERICHIA COLI  Culture, blood (routine x 2)     Status: None (Preliminary result)   Collection Time: 02/23/22  2:02 AM   Specimen: BLOOD  Result Value Ref Range Status   Specimen Description   Final    BLOOD Performed at [facility], [address], [place], [place] [address]    Special Requests   Final    NONE Performed at [facility], [address] [place], [place] [address]    Culture   Final    NO GROWTH 3 DAYS Performed at [facility],  [address], [place], [place] [address]    Report Status PENDING  Incomplete  Culture, blood (routine x 2)     Status: None (Preliminary result)   Collection Time: 02/23/22  2:17 AM   Specimen: BLOOD  Result Value Ref Range Status   Specimen Description   Final    BLOOD Performed at [facility], [address] [place], [place] [address]    Special Requests   Final    NONE Performed at [facility], [address] [place], [place] [address]    Culture   Final    NO GROWTH 3 DAYS Performed at [facility], [address] [place], [place] [address]    Report Status PENDING  Incomplete     Labs: BNP (last 3 results) No results for input(s): "BNP" in the last 8760 hours. Basic Metabolic Panel: Recent Labs  Lab 02/22/22  2136 02/23/22 0914 02/24/22 0441 02/26/22 0318  NA 132* 133* 134* 136  K 3.6 3.1* 3.5 3.4*  CL 95* 97* 99 101  CO2 26 26 24 22   GLUCOSE 162* 121* 118* 132*  BUN 10 <5* <5* <5*  CREATININE 0.91 0.94 0.78 0.74  CALCIUM 10.3 8.9 8.4* 8.8*  MG  --   --  1.6*  --    Liver Function Tests: Recent Labs  Lab 02/22/22 2136 02/23/22 0914 02/24/22 0441 02/26/22 0318  AST 188* 147* 113* 45*  ALT 257* 211* 174* 103*  ALKPHOS 181* 159* 158* 152*  BILITOT 2.1* 2.6* 1.9* 0.8  PROT 8.4* 6.7 5.9* 6.4*  ALBUMIN 3.9 2.5* 2.2* 2.5*   Recent Labs  Lab 02/22/22 2136  LIPASE 14   No results for input(s): "AMMONIA" in the last 168 hours. CBC: Recent Labs  Lab 02/22/22 2136 02/23/22 0914 02/24/22 0441 02/26/22 0318  WBC 15.5* 19.4* 16.8* 11.0*  HGB 11.3* 10.0* 8.4* 8.3*  HCT 33.5* 28.2* 24.6* 24.1*  MCV 73.3* 71.6* 72.6* 73.3*  PLT 183 149* 146* 222   Cardiac Enzymes: No results for input(s): "CKTOTAL", "CKMB", "CKMBINDEX", "TROPONINI" in the last 168 hours. BNP: Invalid input(s): "POCBNP" CBG: No results for input(s): "GLUCAP" in the last 168 hours. D-Dimer No results for input(s): "DDIMER" in the last  72 hours. Hgb A1c No results for input(s): "HGBA1C" in the last 72 hours. Lipid Profile No results for input(s): "CHOL", "HDL", "LDLCALC", "TRIG", "CHOLHDL", "LDLDIRECT" in the last 72 hours. Thyroid function studies No results for input(s): "TSH", "T4TOTAL", "T3FREE", "THYROIDAB" in the last 72 hours.  Invalid input(s): "FREET3" Anemia work up Recent Labs    02/24/22 1301  VITAMINB12 320  FOLATE 13.6  FERRITIN 1,967*  TIBC 193*  IRON 41  RETICCTPCT 1.5   Urinalysis    Component Value Date/Time   COLORURINE YELLOW 02/22/2022 2335   APPEARANCEUR CLOUDY (A) 02/22/2022 2335   LABSPEC 1.010 02/22/2022 2335   PHURINE 6.0 02/22/2022 2335   GLUCOSEU NEGATIVE 02/22/2022 2335   HGBUR SMALL (A) 02/22/2022 2335   BILIRUBINUR SMALL (A) 02/22/2022 2335   KETONESUR NEGATIVE 02/22/2022 2335   PROTEINUR 100 (A) 02/22/2022 2335   UROBILINOGEN >=8.0 02/22/2022 1815   NITRITE NEGATIVE 02/22/2022 2335   LEUKOCYTESUR LARGE (A) 02/22/2022 2335   Sepsis Labs Recent Labs  Lab 02/22/22 2136 02/23/22 0914 02/24/22 0441 02/26/22 0318  WBC 15.5* 19.4* 16.8* 11.0*   Microbiology Recent Results (from the past 240 hour(s))  Urine Culture     Status: Abnormal   Collection Time: 02/22/22  6:45 PM   Specimen: Urine, Clean Catch  Result Value Ref Range Status   Specimen Description URINE, CLEAN CATCH  Final   Special Requests   Final    NONE Performed at [facility], [address], [place], [place]    Culture >=100,000 COLONIES/mL ESCHERICHIA COLI (A)  Final   Report Status 02/24/2022 FINAL  Final   Organism ID, Bacteria ESCHERICHIA COLI (A)  Final      Susceptibility   Escherichia coli - MIC*    AMPICILLIN <=2 SENSITIVE Sensitive     CEFAZOLIN <=4 SENSITIVE Sensitive     CEFEPIME <=0.12 SENSITIVE Sensitive     CEFTRIAXONE <=0.25 SENSITIVE Sensitive     CIPROFLOXACIN <=0.25 SENSITIVE Sensitive     GENTAMICIN <=1 SENSITIVE Sensitive     IMIPENEM <=0.25 SENSITIVE  Sensitive     NITROFURANTOIN <=16 SENSITIVE Sensitive     TRIMETH/SULFA <=20 SENSITIVE Sensitive  AMPICILLIN/SULBACTAM <=2 SENSITIVE Sensitive     PIP/TAZO <=4 SENSITIVE Sensitive     * >=100,000 COLONIES/mL ESCHERICHIA COLI  Resp panel by RT-PCR (RSV, Flu A&B, Covid) Anterior Nasal Swab     Status: None   Collection Time: 02/22/22  9:36 PM   Specimen: Anterior Nasal Swab  Result Value Ref Range Status   SARS Coronavirus 2 by RT PCR NEGATIVE NEGATIVE Final    Comment: (NOTE) SARS-CoV-2 target nucleic acids are NOT DETECTED.  The SARS-CoV-2 RNA is generally detectable in upper respiratory specimens during the acute phase of infection. The lowest concentration of SARS-CoV-2 viral copies this assay can detect is 138 copies/mL. A negative result does not preclude SARS-Cov-2 infection and should not be used as the sole basis for treatment or other patient management decisions. A negative result may occur with  improper specimen collection/handling, submission of specimen other than nasopharyngeal swab, presence of viral mutation(s) within the areas targeted by this assay, and inadequate number of viral copies(<138 copies/mL). A negative result must be combined with clinical observations, patient history, and epidemiological information. The expected result is Negative.  Fact Sheet for Patients:  BloggerCourse.com  Fact Sheet for Healthcare Providers:  SeriousBroker.it  This test is no t yet approved or cleared by the [place] FDA and  has been authorized for detection and/or diagnosis of SARS-CoV-2 by FDA under an Emergency Use Authorization (EUA). This EUA will remain  in effect (meaning this test can be used) for the duration of the COVID-19 declaration under Section 564(b)(1) of the Act, 21 U.S.C.section 360bbb-3(b)(1), unless the authorization is terminated  or revoked sooner.       Influenza A by PCR NEGATIVE NEGATIVE  Final   Influenza B by PCR NEGATIVE NEGATIVE Final    Comment: (NOTE) The Xpert Xpress SARS-CoV-2/FLU/RSV plus assay is intended as an aid in the diagnosis of influenza from Nasopharyngeal swab specimens and should not be used as a sole basis for treatment. Nasal washings and aspirates are unacceptable for Xpert Xpress SARS-CoV-2/FLU/RSV testing.  Fact Sheet for Patients: BloggerCourse.com  Fact Sheet for Healthcare Providers: SeriousBroker.it  This test is not yet approved or cleared by the [place] FDA and has been authorized for detection and/or diagnosis of SARS-CoV-2 by FDA under an Emergency Use Authorization (EUA). This EUA will remain in effect (meaning this test can be used) for the duration of the COVID-19 declaration under Section 564(b)(1) of the Act, 21 U.S.C. section 360bbb-3(b)(1), unless the authorization is terminated or revoked.     Resp Syncytial Virus by PCR NEGATIVE NEGATIVE Final    Comment: (NOTE) Fact Sheet for Patients: BloggerCourse.com  Fact Sheet for Healthcare Providers: SeriousBroker.it  This test is not yet approved or cleared by the [place] FDA and has been authorized for detection and/or diagnosis of SARS-CoV-2 by FDA under an Emergency Use Authorization (EUA). This EUA will remain in effect (meaning this test can be used) for the duration of the COVID-19 declaration under Section 564(b)(1) of the Act, 21 U.S.C. section 360bbb-3(b)(1), unless the authorization is terminated or revoked.  Performed at [facility], [address], [place], [place] [address]   Urine Culture     Status: Abnormal   Collection Time: 02/22/22 11:35 PM   Specimen: Urine, Clean Catch  Result Value Ref Range Status   Specimen Description   Final    URINE, CLEAN CATCH Performed at [facility], [address],  [place], [place] [address]    Special Requests  Final    NONE Performed at [facility], [address], [place], [place] [address]    Culture (A)  Final    >=100,000 COLONIES/mL ESCHERICHIA COLI Two isolates with different morphologies were identified as the same organism.The most resistant organism was reported. Performed at [facility], [address] [place], [place] [address]    Report Status 02/25/2022 FINAL  Final   Organism ID, Bacteria ESCHERICHIA COLI (A)  Final      Susceptibility   Escherichia coli - MIC*    AMPICILLIN 8 SENSITIVE Sensitive     CEFAZOLIN <=4 SENSITIVE Sensitive     CEFEPIME <=0.12 SENSITIVE Sensitive     CEFTRIAXONE <=0.25 SENSITIVE Sensitive     CIPROFLOXACIN <=0.25 SENSITIVE Sensitive     GENTAMICIN <=1 SENSITIVE Sensitive     IMIPENEM <=0.25 SENSITIVE Sensitive     NITROFURANTOIN <=16 SENSITIVE Sensitive     TRIMETH/SULFA <=20 SENSITIVE Sensitive     AMPICILLIN/SULBACTAM <=2 SENSITIVE Sensitive     PIP/TAZO <=4 SENSITIVE Sensitive     * >=100,000 COLONIES/mL ESCHERICHIA COLI  Culture, blood (routine x 2)     Status: None (Preliminary result)   Collection Time: 02/23/22  2:02 AM   Specimen: BLOOD  Result Value Ref Range Status   Specimen Description   Final    BLOOD Performed at [facility], [address], [place], [place] [address]    Special Requests   Final    NONE Performed at [facility], [address], [place], [place] [address]    Culture   Final    NO GROWTH 3 DAYS Performed at [facility], [address] [place], [place] [address]    Report Status PENDING  Incomplete  Culture, blood (routine x 2)     Status: None (Preliminary result)   Collection Time: 02/23/22  2:17 AM   Specimen: BLOOD  Result Value Ref Range Status   Specimen Description   Final    BLOOD Performed at [facility], [address], [place], [place] [address]     Special Requests   Final    NONE Performed at [facility], [address], [place], [place] [address]    Culture   Final    NO GROWTH 3 DAYS Performed at [facility], [address] [place], [place] [address]    Report Status PENDING  Incomplete     Time coordinating discharge: Over 30 minutes  SIGNED:   Azucena FallenWilliam C Alfonso Shackett, DO [facility] 02/26/2022, 1:18 PM Pager   If 7PM-7AM, please contact night-coverage [email or web address]

## 2022-02-28 LAB — CULTURE, BLOOD (ROUTINE X 2)
Culture: NO GROWTH
Culture: NO GROWTH

## 2022-07-17 ENCOUNTER — Encounter: Payer: 59 | Admitting: Obstetrics and Gynecology

## 2022-07-17 ENCOUNTER — Encounter: Payer: Self-pay | Admitting: Obstetrics and Gynecology

## 2022-07-17 NOTE — Progress Notes (Signed)
Patient did not keep her new GYN appointment for 07/17/2022.  Cornelia Copa MD Attending Center for [facility])

## 2022-07-24 ENCOUNTER — Encounter: Payer: Self-pay | Admitting: *Deleted

## 2022-09-28 ENCOUNTER — Encounter (HOSPITAL_COMMUNITY): Payer: Self-pay

## 2022-09-28 ENCOUNTER — Other Ambulatory Visit: Payer: Self-pay

## 2022-09-28 ENCOUNTER — Emergency Department (HOSPITAL_COMMUNITY): Payer: 59

## 2022-09-28 ENCOUNTER — Emergency Department (HOSPITAL_COMMUNITY)
Admission: EM | Admit: 2022-09-28 | Discharge: 2022-09-28 | Disposition: A | Discharge status: Home / Self Care | Payer: 59 | Attending: Emergency Medicine | Admitting: Emergency Medicine

## 2022-09-28 DIAGNOSIS — S0990XA Unspecified injury of head, initial encounter: Secondary | ICD-10-CM | POA: Insufficient documentation

## 2022-09-28 DIAGNOSIS — S199XXA Unspecified injury of neck, initial encounter: Secondary | ICD-10-CM | POA: Diagnosis present

## 2022-09-28 DIAGNOSIS — Y92481 Parking lot as the place of occurrence of the external cause: Secondary | ICD-10-CM | POA: Insufficient documentation

## 2022-09-28 DIAGNOSIS — S39012A Strain of muscle, fascia and tendon of lower back, initial encounter: Secondary | ICD-10-CM | POA: Diagnosis not present

## 2022-09-28 DIAGNOSIS — S161XXA Strain of muscle, fascia and tendon at neck level, initial encounter: Secondary | ICD-10-CM | POA: Diagnosis not present

## 2022-09-28 MED ORDER — METHOCARBAMOL 500 MG PO TABS: 500.0000 mg | ORAL_TABLET | Freq: Two times a day (BID) | ORAL | 0 refills | Status: DC | PRN

## 2022-09-28 MED ORDER — TRAMADOL HCL 50 MG PO TABS: 50.0000 mg | ORAL_TABLET | Freq: Four times a day (QID) | ORAL | 0 refills | Status: DC | PRN
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
## 2023-01-02 DIAGNOSIS — N83209 Unspecified ovarian cyst, unspecified side: Secondary | ICD-10-CM

## 2023-01-02 HISTORY — DX: Unspecified ovarian cyst, unspecified side: N83.209

## 2023-01-08 ENCOUNTER — Telehealth (HOSPITAL_BASED_OUTPATIENT_CLINIC_OR_DEPARTMENT_OTHER): Payer: Self-pay | Admitting: Certified Nurse Midwife

## 2023-01-08 ENCOUNTER — Ambulatory Visit (HOSPITAL_BASED_OUTPATIENT_CLINIC_OR_DEPARTMENT_OTHER): Payer: 59 | Admitting: Certified Nurse Midwife

## 2023-01-08 NOTE — Telephone Encounter (Signed)
Called patient about was unable to leave her a message about missed appointment .

## 2023-01-09 ENCOUNTER — Encounter (HOSPITAL_BASED_OUTPATIENT_CLINIC_OR_DEPARTMENT_OTHER): Payer: Self-pay | Admitting: Certified Nurse Midwife

## 2023-01-09 ENCOUNTER — Ambulatory Visit (HOSPITAL_BASED_OUTPATIENT_CLINIC_OR_DEPARTMENT_OTHER): Payer: Medicare HMO | Admitting: Certified Nurse Midwife

## 2023-01-09 VITALS — BP 123/79 | HR 101 | Ht 66.25 in | Wt 174.0 lb

## 2023-01-09 DIAGNOSIS — N643 Galactorrhea not associated with childbirth: Secondary | ICD-10-CM | POA: Diagnosis not present

## 2023-01-09 DIAGNOSIS — N926 Irregular menstruation, unspecified: Secondary | ICD-10-CM

## 2023-01-09 DIAGNOSIS — R140 Abdominal distension (gaseous): Secondary | ICD-10-CM | POA: Diagnosis not present

## 2023-01-09 DIAGNOSIS — N951 Menopausal and female climacteric states: Secondary | ICD-10-CM

## 2023-01-09 DIAGNOSIS — R635 Abnormal weight gain: Secondary | ICD-10-CM

## 2023-01-09 NOTE — Progress Notes (Signed)
  GYNECOLOGY  VISIT  CC:   pt thinks she may be pregnant  HPI: 52 y.o. No obstetric history on file. Married [place] or [place] American female here for possible pregnancy. Accompanied by her supportive significant other "Zollie Beckers". Reports she had a BTL years ago. Pt reports she is having pregnancy symptoms, leaking "milk" bilaterally from breasts, weight gain in abdomen, increased appetite. Pt reports she has had 2 periods "over the past five months". No hot flashes or night sweats. No intermenstrual spotting or bleeding.    Past Medical History:  Diagnosis Date   Allergy    Anxiety    Arthritis    Depression    Narcolepsy     MEDS:   Current Outpatient Medications on File Prior to Visit  Medication Sig Dispense Refill   acetaminophen (TYLENOL) 500 MG tablet Take 1,000 mg by mouth every 6 (six) hours as needed for mild pain or fever.     albuterol (VENTOLIN HFA) 108 (90 Base) MCG/ACT inhaler Inhale 1-2 puffs into the lungs every 6 (six) hours as needed for wheezing or shortness of breath. (Patient not taking: Reported on 01/09/2023) 8 g 0   cetirizine (ZYRTEC) 10 MG tablet Take 1 tablet (10 mg total) by mouth daily. (Patient not taking: Reported on 01/09/2023) 30 tablet 11   Guaifenesin 1200 MG TB12 Take 1 tablet (1,200 mg total) by mouth in the morning and at bedtime. (Patient not taking: Reported on 01/09/2023) 14 tablet 0   methocarbamol (ROBAXIN) 500 MG tablet Take 1 tablet (500 mg total) by mouth 2 (two) times daily as needed for muscle spasms. (Patient not taking: Reported on 01/09/2023) 20 tablet 0   OVER THE COUNTER MEDICATION Take 2 tablets by mouth every 6 (six) hours as needed (bladder pain). URISTAT (Patient not taking: Reported on 01/09/2023)     pantoprazole (PROTONIX) 20 MG tablet Take 1 tablet (20 mg total) by mouth daily. (Patient not taking: Reported on 02/23/2022) 30 tablet 0   traMADol (ULTRAM) 50 MG tablet Take 1 tablet (50 mg total) by mouth every 6 (six) hours as  needed. (Patient not taking: Reported on 01/09/2023) 15 tablet 0   No current facility-administered medications on file prior to visit.    ALLERGIES: Aspirin   PHYSICAL EXAMINATION:    BP 123/79 (BP Location: Left Arm, Patient Position: Sitting, Cuff Size: Large)   Pulse (!) 101   Ht 5' 6.25" (1.683 m)   Wt 174 lb (78.9 kg)   BMI 27.87 kg/m     General appearance: alert, cooperative and appears stated age Abdomen: Soft/ Non-tender Pelvic: Pt declined pelvic exam today, states she recently had pelvic exam and pap smear-records requested  Assessment/Plan: 1. Excessive weight gain/Rule out Pregnancy/Hx BTL - UPT Negative - CBC - Comp Met (CMET) - Prolactin - Thyroid Panel With TSH - Beta hCG quant (ref lab)  2. Galactorrhea - Prolactin - Thyroid Panel With TSH - Beta hCG quant (ref lab)  3. Abdominal bloating - RTO GYN [place] to evaluate uterus/ovaries - CA 125  4. Irregular menses - FSH  Pt will RTO for GYN [place] to evaluate uterus/ovaries (abdominal bloating) and follow-up with Merrilee Jansky CNM. Letta Kocher

## 2023-01-10 LAB — THYROID PANEL WITH TSH
Free Thyroxine Index: 1.5 (ref 1.2–4.9)
T3 Uptake Ratio: 24 % (ref 24–39)
T4, Total: 6.3 ug/dL (ref 4.5–12.0)
TSH: 1.54 u[IU]/mL (ref 0.450–4.500)

## 2023-01-10 LAB — COMPREHENSIVE METABOLIC PANEL
ALT: 18 [IU]/L (ref 0–32)
AST: 17 [IU]/L (ref 0–40)
Albumin: 4.6 g/dL (ref 3.9–4.9)
Alkaline Phosphatase: 164 [IU]/L — ABNORMAL HIGH (ref 44–121)
BUN/Creatinine Ratio: 12 (ref 9–23)
BUN: 10 mg/dL (ref 6–24)
Bilirubin Total: 0.3 mg/dL (ref 0.0–1.2)
CO2: 21 mmol/L (ref 20–29)
Calcium: 9.8 mg/dL (ref 8.7–10.2)
Chloride: 102 mmol/L (ref 96–106)
Creatinine, Ser: 0.82 mg/dL (ref 0.57–1.00)
Globulin, Total: 3 g/dL (ref 1.5–4.5)
Glucose: 110 mg/dL — ABNORMAL HIGH (ref 70–99)
Potassium: 4.4 mmol/L (ref 3.5–5.2)
Sodium: 137 mmol/L (ref 134–144)
Total Protein: 7.6 g/dL (ref 6.0–8.5)
eGFR: 87 mL/min/{1.73_m2} (ref >59)

## 2023-01-10 LAB — CBC
Hematocrit: 44 % (ref 34.0–46.6)
Hemoglobin: 13.9 g/dL (ref 11.1–15.9)
MCH: 25.4 pg — ABNORMAL LOW (ref 26.6–33.0)
MCHC: 31.6 g/dL (ref 31.5–35.7)
MCV: 80 fL (ref 79–97)
Platelets: 224 10*3/uL (ref 150–450)
RBC: 5.48 x10E6/uL — ABNORMAL HIGH (ref 3.77–5.28)
RDW: 13.1 % (ref 11.7–15.4)
WBC: 7.8 10*3/uL (ref 3.4–10.8)

## 2023-01-10 LAB — BETA HCG QUANT (REF LAB): hCG Quant: <1 m[IU]/mL

## 2023-01-10 LAB — PROLACTIN: Prolactin: 10.8 ng/mL (ref 4.8–33.4)

## 2023-01-10 LAB — CA 125: Cancer Antigen (CA) 125: 14.3 U/mL (ref 0.0–38.1)

## 2023-01-24 ENCOUNTER — Other Ambulatory Visit (HOSPITAL_BASED_OUTPATIENT_CLINIC_OR_DEPARTMENT_OTHER): Payer: Self-pay | Admitting: Certified Nurse Midwife

## 2023-01-24 ENCOUNTER — Encounter (HOSPITAL_BASED_OUTPATIENT_CLINIC_OR_DEPARTMENT_OTHER): Payer: Self-pay | Admitting: Certified Nurse Midwife

## 2023-01-25 ENCOUNTER — Ambulatory Visit (INDEPENDENT_AMBULATORY_CARE_PROVIDER_SITE_OTHER): Payer: Medicare HMO | Admitting: Family Medicine

## 2023-01-25 ENCOUNTER — Other Ambulatory Visit (HOSPITAL_BASED_OUTPATIENT_CLINIC_OR_DEPARTMENT_OTHER): Payer: Self-pay | Admitting: Family Medicine

## 2023-01-25 ENCOUNTER — Encounter (HOSPITAL_BASED_OUTPATIENT_CLINIC_OR_DEPARTMENT_OTHER): Payer: Self-pay | Admitting: Family Medicine

## 2023-01-25 ENCOUNTER — Encounter (HOSPITAL_BASED_OUTPATIENT_CLINIC_OR_DEPARTMENT_OTHER): Payer: Medicare HMO | Admitting: Certified Nurse Midwife

## 2023-01-25 ENCOUNTER — Encounter (HOSPITAL_BASED_OUTPATIENT_CLINIC_OR_DEPARTMENT_OTHER): Payer: Self-pay | Admitting: *Deleted

## 2023-01-25 VITALS — BP 116/74 | HR 83 | Ht 66.0 in | Wt 178.4 lb

## 2023-01-25 DIAGNOSIS — Z1231 Encounter for screening mammogram for malignant neoplasm of breast: Secondary | ICD-10-CM

## 2023-01-25 DIAGNOSIS — E663 Overweight: Secondary | ICD-10-CM | POA: Insufficient documentation

## 2023-01-25 DIAGNOSIS — R632 Polyphagia: Secondary | ICD-10-CM | POA: Insufficient documentation

## 2023-01-25 DIAGNOSIS — Z7689 Persons encountering health services in other specified circumstances: Secondary | ICD-10-CM

## 2023-01-25 DIAGNOSIS — N643 Galactorrhea not associated with childbirth: Secondary | ICD-10-CM

## 2023-01-25 NOTE — Progress Notes (Signed)
New Patient Office Visit  Subjective:   Sherry Leonard [birth year] 01/25/2023  Chief Complaint  Patient presents with   New Patient (Initial Visit)    Patient is here today to get established with the practice. Has concerns about her weight and also states she feels bloated all the time.    HPI: Sherry Leonard presents today to establish care at [facility] at [facility]. Introduced to Publishing rights manager role and practice setting.  All questions answered.   Last PCP: [facility] Last annual physical: Unknown  Concerns: See below   WEIGHT CONCERN: Sherry Leonard presents for weight management. Patient states increasing weight has been occurring 2-3 months.   Patient reports having abdominal US with [facility] in November 2024 possibly due to elevated LFT and bloating - results not available in chart. Patient states some days she is significantly hungry and heating 'all day" and other days she has noticed increased thirst. She reports prior hx of prediabetes and was on medication in the past- unable to recall name of medication. Her diet currently is high in carbohydrates and sugars per patient.  She has noticed increase in urination, denies dysuria, urgency, hematuria or odor.   Patient reports BM 2-3 x per week. Denies change in color, texture.   Wt Readings from Last 3 Encounters:  01/25/23 178 lb 6.4 oz (80.9 kg)  01/09/23 174 lb (78.9 kg)  09/28/22 150 lb (68 kg)   GALACTORRHEA:  Patient has noticed white odorous discharge from bilateral breast for several weeks. She states discharge began in right breast first. Deneis pain, redness, skin changes, or palpable masses to both breasts. She was seen by Ether Griffins on 01/09/2023 for this concern and had labs for workup including thyroid, prolactin, beta hcg and CA 125 which were all unremarkable. Patient reports her last mamm[facility] with [facility] was in June 2023. Will request  records. She has had 2 periods over the past 5 months.  No LMP recorded. (Menstrual status: Perimenopausal).   The following portions of the patient's history were reviewed and updated as appropriate: past medical history, past surgical history, family history, social history, allergies, medications, and problem list.   Patient Active Problem List   Diagnosis Date Noted   Galactorrhea 01/09/2023   Abdominal bloating 01/09/2023   Perimenopause 01/09/2023   Microcytic hypochromic anemia 02/23/2022   Hyperbilirubinemia 02/23/2022   Primary narcolepsy without cataplexy 04/18/2017   Chronic fatigue 04/18/2017   Severe depression (HCC) 04/18/2017   Tobacco use disorder, severe, dependence 04/18/2017   Chronic rhinitis 04/18/2017   Past Medical History:  Diagnosis Date   Allergy    Anxiety    Arthritis    Depression    Hypokalemia 02/23/2022   Hyponatremia 02/23/2022   Narcolepsy    Ovarian cyst 01/02/2023   4cm ovarian cyst 01/02/23   Pyelonephritis 02/23/2022   Sepsis (HCC) 02/23/2022   Transaminitis 02/23/2022   Past Surgical History:  Procedure Laterality Date   TUBAL LIGATION     Family History  Problem Relation Age of Onset   Cancer Maternal Grandfather    Social History   Socioeconomic History   Marital status: Married    Spouse name: Not on file   Number of children: Not on file   Years of education: Not on file   Highest education level: Not on file  Occupational History   Not on file  Tobacco Use   Smoking status: Every Day  Current packs/day: 2.00    Types: Cigarettes   Smokeless tobacco: Never  Vaping Use   Vaping status: Never Used  Substance and Sexual Activity   Alcohol use: Yes    Comment: rare   Drug use: No   Sexual activity: Yes    Birth control/protection: None  Other Topics Concern   Not on file  Social History Narrative   Not on file   Social Drivers of Health   Financial Resource Strain: Low Risk  (01/25/2023)   Overall  Financial Resource Strain (CARDIA)    Difficulty of Paying Living Expenses: Not very hard  Food Insecurity: No Food Insecurity (01/25/2023)   Hunger Vital Sign    Worried About Running Out of Food in the Last Year: Never true    Ran Out of Food in the Last Year: Never true  Transportation Needs: No Transportation Needs (01/25/2023)   PRAPARE - Administrator, Civil Service (Medical): No    Lack of Transportation (Non-Medical): No  Physical Activity: Inactive (01/25/2023)   Exercise Vital Sign    Days of Exercise per Week: 0 days    Minutes of Exercise per Session: 0 min  Stress: Stress Concern Present (01/25/2023)   [employer] of Occupational Health - Occupational Stress Questionnaire    Feeling of Stress : To some extent  Social Connections: Moderately Isolated (01/25/2023)   Social Connection and Isolation Panel [NHANES]    Frequency of Communication with Friends and Family: More than three times a week    Frequency of Social Gatherings with Friends and Family: More than three times a week    Attends Religious Services: Never    Database administrator or Organizations: No    Attends Banker Meetings: Never    Marital Status: Living with partner  Intimate Partner Violence: Not At Risk (01/25/2023)   Humiliation, Afraid, Rape, and Kick questionnaire    Fear of Current or Ex-Partner: No    Emotionally Abused: No    Physically Abused: No    Sexually Abused: No   Outpatient Medications Prior to Visit  Medication Sig Dispense Refill   acetaminophen (TYLENOL) 500 MG tablet Take 1,000 mg by mouth every 6 (six) hours as needed for mild pain or fever.     OVER THE COUNTER MEDICATION Take 2 tablets by mouth every 6 (six) hours as needed (bladder pain). URISTAT (Patient not taking: Reported on 01/09/2023)     No facility-administered medications prior to visit.   Allergies  Allergen Reactions   Aspirin     Itch Able to tolerate Tylenol    ROS: A  complete ROS was performed with pertinent positives/negatives noted in the HPI. The remainder of the ROS are negative.   Objective:   Today's Vitals   01/25/23 1020  BP: 116/74  Pulse: 83  SpO2: 99%  Weight: 178 lb 6.4 oz (80.9 kg)  Height: 5\' 6"  (1.676 m)    GENERAL: Well-appearing, in NAD. Well nourished.  SKIN: Pink, warm and dry. No rash, lesion, ulceration, or ecchymoses.  Head: Normocephalic. NECK: Trachea midline. Full ROM w/o pain or tenderness. No lymphadenopathy or thyromegaly.  RESPIRATORY: Chest wall symmetrical. Respirations even and non-labored. Breath sounds clear to auscultation bilaterally.  BREASTS: Breasts pendulous, symmetrical, and w/o palpable masses. Nipples everted and w/o discharge. Discharge unable to be expressed by PCP. No rash or skin retraction. No axillary or supraclavicular lymphadenopathy. Chaperoned by Cristy Hilts, CMA.   CARDIAC: S1, S2 present, regular rate and  rhythm without murmur or gallops. Peripheral pulses 2+ bilaterally.  GI: Abdomen soft, non-tender, non-distended. No ascites or fluid wave. Normoactive bowel sounds. No rebound tenderness. No hepatomegaly or splenomegaly. No CVA tenderness.  MSK: Muscle tone and strength appropriate for age.  EXTREMITIES: Without clubbing, cyanosis, or edema.  NEUROLOGIC: No motor or sensory deficits. Steady, even gait. C2-C12 intact.  PSYCH/MENTAL STATUS: Alert, oriented x 3. Cooperative, appropriate mood and affect.    Health Maintenance Due  Topic Date Due   Cervical Cancer Screening (HPV/Pap Cotest)  Never done   Fecal DNA (Cologuard)  Never done   Medicare Annual Wellness (AWV)  11/23/2018   MAMMOGRAM  Never done    No results found for any visits on 01/25/23.     Assessment & Plan:  1. Encounter to establish care with new doctor (Primary) Discussed role of PCP with patient and records release obtained for [facility] prior PCP. Pt reports she is UTD on pap, cologuard and Toma Copier should  produce these records.   2. Polyphagia 3. Overweight (BMI 25.0-29.9) Pt reports hx of prediabetes in the past. Given polyphagia, polydipsia, polyuria, and weight gain, I am concerned patient will be a type 2 DM that is uncontrolled. Will obtain lab work today. Recent TSH is WNL. Discussed dietary changes w/ lower carb, higher protein, regular exercise and pt verbalized understanding. Will consider nutrition referral.   - CBC with Differential/Platelet - Comprehensive metabolic panel - Lipid panel - Hemoglobin A1c  4. Galactorrhea of both breasts Workup ongoing with OBGYN. Pt is due for mammogram and will rule out acute cause with mammo and bilateral breast US. Recent labs reviewed. Will obtain prior mammo records from [place] as well.   - MM 3D SCREENING MAMMOGRAM BILATERAL BREAST; Future - [place] LIMITED ULTRASOUND INCLUDING AXILLA LEFT BREAST ; Future - [place] LIMITED ULTRASOUND INCLUDING AXILLA RIGHT BREAST; Future  5. Breast cancer screening by mammogram - MM 3D SCREENING MAMMOGRAM BILATERAL BREAST; Future   Patient to reach out to office if new, worrisome, or unresolved symptoms arise or if no improvement in patient's condition. Patient verbalized understanding and is agreeable to treatment plan. All questions answered to patient's satisfaction.    Return in about 3 months (around 04/25/2023) for Follow up Chronic Conditions .    Hilbert Bible, [place]

## 2023-01-26 ENCOUNTER — Ambulatory Visit (HOSPITAL_BASED_OUTPATIENT_CLINIC_OR_DEPARTMENT_OTHER): Payer: Medicare HMO | Admitting: Family Medicine

## 2023-01-26 ENCOUNTER — Encounter (HOSPITAL_BASED_OUTPATIENT_CLINIC_OR_DEPARTMENT_OTHER): Payer: Self-pay | Admitting: Family Medicine

## 2023-01-26 VITALS — Ht 66.0 in | Wt 178.0 lb

## 2023-01-26 DIAGNOSIS — Z0000 Encounter for general adult medical examination without abnormal findings: Secondary | ICD-10-CM

## 2023-01-26 LAB — CBC WITH DIFFERENTIAL/PLATELET
Basophils Absolute: 0.1 10*3/uL (ref 0.0–0.2)
Basos: 1 %
EOS (ABSOLUTE): 0.3 10*3/uL (ref 0.0–0.4)
Eos: 3 %
Hematocrit: 42.5 % (ref 34.0–46.6)
Hemoglobin: 12.7 g/dL (ref 11.1–15.9)
Immature Grans (Abs): 0 10*3/uL (ref 0.0–0.1)
Immature Granulocytes: 0 %
Lymphocytes Absolute: 4.3 10*3/uL — ABNORMAL HIGH (ref 0.7–3.1)
Lymphs: 45 %
MCH: 25 pg — ABNORMAL LOW (ref 26.6–33.0)
MCHC: 29.9 g/dL — ABNORMAL LOW (ref 31.5–35.7)
MCV: 84 fL (ref 79–97)
Monocytes Absolute: 0.8 10*3/uL (ref 0.1–0.9)
Monocytes: 9 %
Neutrophils Absolute: 4 10*3/uL (ref 1.4–7.0)
Neutrophils: 42 %
Platelets: 184 10*3/uL (ref 150–450)
RBC: 5.07 x10E6/uL (ref 3.77–5.28)
RDW: 14 % (ref 11.7–15.4)
WBC: 9.5 10*3/uL (ref 3.4–10.8)

## 2023-01-26 LAB — COMPREHENSIVE METABOLIC PANEL
ALT: 20 [IU]/L (ref 0–32)
AST: 18 [IU]/L (ref 0–40)
Albumin: 4.4 g/dL (ref 3.9–4.9)
Alkaline Phosphatase: 153 [IU]/L — ABNORMAL HIGH (ref 44–121)
BUN/Creatinine Ratio: 13 (ref 9–23)
BUN: 12 mg/dL (ref 6–24)
Bilirubin Total: 0.2 mg/dL (ref 0.0–1.2)
CO2: 22 mmol/L (ref 20–29)
Calcium: 9.6 mg/dL (ref 8.7–10.2)
Chloride: 104 mmol/L (ref 96–106)
Creatinine, Ser: 0.92 mg/dL (ref 0.57–1.00)
Globulin, Total: 2.9 g/dL (ref 1.5–4.5)
Glucose: 90 mg/dL (ref 70–99)
Potassium: 4.4 mmol/L (ref 3.5–5.2)
Sodium: 141 mmol/L (ref 134–144)
Total Protein: 7.3 g/dL (ref 6.0–8.5)
eGFR: 76 mL/min/{1.73_m2} (ref >59)

## 2023-01-26 LAB — HEMOGLOBIN A1C
Est. average glucose Bld gHb Est-mCnc: 131 mg/dL
Hgb A1c MFr Bld: 6.2 % — ABNORMAL HIGH (ref 4.8–5.6)

## 2023-01-26 LAB — LIPID PANEL
Chol/HDL Ratio: 5.6 {ratio} — ABNORMAL HIGH (ref 0.0–4.4)
Cholesterol, Total: 207 mg/dL — ABNORMAL HIGH (ref 100–199)
HDL: 37 mg/dL — ABNORMAL LOW (ref >39)
LDL Chol Calc (NIH): 135 mg/dL — ABNORMAL HIGH (ref 0–99)
Triglycerides: 196 mg/dL — ABNORMAL HIGH (ref 0–149)
VLDL Cholesterol Cal: 35 mg/dL (ref 5–40)

## 2023-01-26 NOTE — Progress Notes (Signed)
Subjective:   Sherry Leonard is a 52 y.o. female who presents for Medicare Annual (Subsequent) preventive examination.  Visit Complete: Virtual I connected with  Sherry Leonard on 01/26/23 by a audio enabled telemedicine application and verified that I am speaking with the correct person using two identifiers.  Patient Location: Home  Provider Location: Office/Clinic  I discussed the limitations of evaluation and management by telemedicine. The patient expressed understanding and agreed to proceed.  Vital Signs: Because this visit was a virtual/telehealth visit, some criteria may be missing or patient reported. Any vitals not documented were not able to be obtained and vitals that have been documented are patient reported.  Patient Medicare AWV questionnaire was completed by the patient on 01/26/2023; I have confirmed that all information answered by patient is correct and no changes since this date.        Objective:    Today's Vitals   01/26/23 0921 01/26/23 0922  Weight: 178 lb (80.7 kg)   Height: 5\' 6"  (1.676 m)   PainSc:  9    Body mass index is 28.73 kg/m.     01/26/2023    9:32 AM 09/28/2022    3:27 PM 02/22/2022    9:33 PM 03/06/2016   10:01 PM 01/22/2014    8:28 PM  Advanced Directives  Does Patient Have a Medical Advance Directive? No No No No No  Would patient like information on creating a medical advance directive? No - Patient declined  No - Patient declined      Current Medications (verified) Outpatient Encounter Medications as of 01/26/2023  Medication Sig   acetaminophen (TYLENOL) 500 MG tablet Take 1,000 mg by mouth every 6 (six) hours as needed for mild pain or fever.   No facility-administered encounter medications on file as of 01/26/2023.    Allergies (verified) Aspirin   History: Past Medical History:  Diagnosis Date   Allergy    Anxiety    Arthritis    Depression    Hypokalemia 02/23/2022   Hyponatremia 02/23/2022   Narcolepsy    Ovarian  cyst 01/02/2023   4cm ovarian cyst 01/02/23   Pyelonephritis 02/23/2022   Sepsis (HCC) 02/23/2022   Transaminitis 02/23/2022   Past Surgical History:  Procedure Laterality Date   TUBAL LIGATION     Family History  Problem Relation Age of Onset   Cancer Maternal Grandfather    Social History   Socioeconomic History   Marital status: Married    Spouse name: Not on file   Number of children: Not on file   Years of education: Not on file   Highest education level: Not on file  Occupational History   Not on file  Tobacco Use   Smoking status: Every Day    Current packs/day: 2.00    Average packs/day: 2.0 packs/day for 44.9 years (89.9 ttl pk-yrs)    Types: Cigarettes    Start date: 45   Smokeless tobacco: Never  Vaping Use   Vaping status: Never Used  Substance and Sexual Activity   Alcohol use: Yes    Comment: rare   Drug use: No   Sexual activity: Yes    Birth control/protection: None  Other Topics Concern   Not on file  Social History Narrative   Not on file   Social Drivers of Health   Financial Resource Strain: Low Risk  (01/26/2023)   Overall Financial Resource Strain (CARDIA)    Difficulty of Paying Living Expenses: Not very hard  Food Insecurity: No  Food Insecurity (01/26/2023)   Hunger Vital Sign    Worried About Running Out of Food in the Last Year: Never true    Ran Out of Food in the Last Year: Never true  Transportation Needs: No Transportation Needs (01/26/2023)   PRAPARE - Administrator, Civil Service (Medical): No    Lack of Transportation (Non-Medical): No  Physical Activity: Insufficiently Active (01/26/2023)   Exercise Vital Sign    Days of Exercise per Week: 7 days    Minutes of Exercise per Session: 10 min  Stress: No Stress Concern Present (01/26/2023)   [employer] of Occupational Health - Occupational Stress Questionnaire    Feeling of Stress : Not at all  Recent Concern: Stress - Stress Concern Present  (01/25/2023)   [employer] of Occupational Health - Occupational Stress Questionnaire    Feeling of Stress : To some extent  Social Connections: Moderately Isolated (01/26/2023)   Social Connection and Isolation Panel [NHANES]    Frequency of Communication with Friends and Family: More than three times a week    Frequency of Social Gatherings with Friends and Family: More than three times a week    Attends Religious Services: Never    Database administrator or Organizations: No    Attends Banker Meetings: Never    Marital Status: Living with partner    Tobacco Counseling Ready to quit: Not Answered Counseling given: Not Answered   Clinical Intake:     Activities of Daily Living    01/26/2023    9:23 AM 02/23/2022    6:33 AM  In your present state of health, do you have any difficulty performing the following activities:  Hearing? 0   Vision? 1   Difficulty concentrating or making decisions? 0   Walking or climbing stairs? 1   Comment Due to her constant pain   Dressing or bathing? 0   Doing errands, shopping? 0 0  Preparing Food and eating ? N   Using the Toilet? N   In the past six months, have you accidently leaked urine? Y   Do you have problems with loss of bowel control? N   Managing your Medications? N   Managing your Finances? N   Housekeeping or managing your Housekeeping? N     Patient Care Team: Caudle, Shelton Silvas, FNP as PCP - General (Family Medicine)  Indicate any recent Medical Services you may have received from other than [facility] providers in the past year (date may be approximate).     Assessment:   This is a routine wellness examination for Sherry Leonard.  Hearing/Vision screen Hearing Screening - Comments:: Patient denies hearing difficulty Vision Screening - Comments:: Patient states she does struggle with vision issues, does not know her eye doctors name she needs new contacts   Goals Addressed               This Visit's  Progress     Patient Stated (pt-stated)        Get her pain under control, figure out what is going on inside her body      Depression Screen    01/26/2023    9:27 AM 01/25/2023   10:27 AM 01/09/2023   10:12 AM 04/23/2017    4:02 PM 04/12/2017    2:41 PM  PHQ 2/9 Scores  PHQ - 2 Score 0 0 0 5 5  PHQ- 9 Score 6 6  22 24     Fall Risk  01/26/2023    9:33 AM 01/25/2023   10:27 AM 01/09/2023   10:12 AM 04/23/2017    4:02 PM 04/12/2017    2:41 PM  Fall Risk   Falls in the past year? 0 0 0 No No  Number falls in past yr: 0 0 0    Injury with Fall? 0 0 0    Risk for fall due to : No Fall Risks No Fall Risks     Follow up Falls evaluation completed Falls evaluation completed       MEDICARE RISK AT HOME: Medicare Risk at Home Any stairs in or around the home?: Yes If so, are there any without handrails?: No Home free of loose throw rugs in walkways, pet beds, electrical cords, etc?: Yes Adequate lighting in your home to reduce risk of falls?: Yes Life alert?: No Use of a cane, walker or w/c?: No Grab bars in the bathroom?: No Shower chair or bench in shower?: No Elevated toilet seat or a handicapped toilet?: No  TIMED UP AND GO:  Was the test performed?  No    Cognitive Function:    01/26/2023    9:34 AM  MMSE - Mini Mental State Exam  Not completed: Unable to complete        01/26/2023    9:34 AM  6CIT Screen  What Year? 0 points  What month? 0 points  What time? 0 points  Count back from 20 0 points  Months in reverse 0 points  Repeat phrase 0 points  Total Score 0 points    Immunizations  There is no immunization history on file for this patient.  TDAP status: Due, Education has been provided regarding the importance of this vaccine. Advised may receive this vaccine at local pharmacy or Health Dept. Aware to provide a copy of the vaccination record if obtained from local pharmacy or Health Dept. Verbalized acceptance and understanding.  Flu Vaccine  status: Declined, Education has been provided regarding the importance of this vaccine but patient still declined. Advised may receive this vaccine at local pharmacy or Health Dept. Aware to provide a copy of the vaccination record if obtained from local pharmacy or Health Dept. Verbalized acceptance and understanding.  Pneumococcal vaccine status: Declined,  Education has been provided regarding the importance of this vaccine but patient still declined. Advised may receive this vaccine at local pharmacy or Health Dept. Aware to provide a copy of the vaccination record if obtained from local pharmacy or Health Dept. Verbalized acceptance and understanding.   Covid-19 vaccine status: Declined, Education has been provided regarding the importance of this vaccine but patient still declined. Advised may receive this vaccine at local pharmacy or Health Dept.or vaccine clinic. Aware to provide a copy of the vaccination record if obtained from local pharmacy or Health Dept. Verbalized acceptance and understanding.  Qualifies for Shingles Vaccine? Yes   Zostavax completed No   Shingrix Completed?: No.    Education has been provided regarding the importance of this vaccine. Patient has been advised to call insurance company to determine out of pocket expense if they have not yet received this vaccine. Advised may also receive vaccine at local pharmacy or Health Dept. Verbalized acceptance and understanding.  Screening Tests Health Maintenance  Topic Date Due   Cervical Cancer Screening (HPV/Pap Cotest)  Never done   Fecal DNA (Cologuard)  Never done   Lung Cancer Screening  Never done   MAMMOGRAM  Never done   COVID-19 Vaccine (1 -  2024-25 season) 02/10/2023 (Originally 10/15/2022)   Zoster Vaccines- Shingrix (1 of 2) 04/25/2023 (Originally 02/06/2022)   INFLUENZA VACCINE  05/14/2023 (Originally 09/14/2022)   DTaP/Tdap/Td (1 - Tdap) 01/25/2024 (Originally [birth year]1991)   Medicare Annual Wellness (AWV)   01/26/2024   Hepatitis C Screening  Completed   HIV Screening  Completed   HPV VACCINES  Aged Out    Health Maintenance  Health Maintenance Due  Topic Date Due   Cervical Cancer Screening (HPV/Pap Cotest)  Never done   Fecal DNA (Cologuard)  Never done   Lung Cancer Screening  Never done   MAMMOGRAM  Never done    Colorectal cancer screening: Type of screening: Cologuard. Completed 07/14/2021. Repeat every 3 years Records were requested at patients initial visit yesterday. Mammogram status: Completed 2023. Repeat every year   Lung Cancer Screening: (Low Dose CT Chest recommended if Age 17-80 years, 20 pack-year currently smoking OR have quit w/in 15years.) does qualify.   Lung Cancer Screening Referral: Referral placed today  Additional Screening:  Hepatitis C Screening: does qualify; Completed 02/23/2022  Vision Screening: Recommended annual ophthalmology exams for early detection of glaucoma and other disorders of the eye. Is the patient up to date with their annual eye exam?  No  Who is the provider or what is the name of the office in which the patient attends annual eye exams? [facility] in [place], on [address] unsure of their name If pt is not established with a provider, would they like to be referred to a provider to establish care? No .   Dental Screening: Recommended annual dental exams for proper oral hygiene   Community Resource Referral / Chronic Care Management: CRR required this visit?  No   CCM required this visit?  No     Plan:     I have personally reviewed and noted the following in the patient's chart:   Medical and social history Use of alcohol, tobacco or illicit drugs  Current medications and supplements including opioid prescriptions. Patient is not currently taking opioid prescriptions. Functional ability and status Nutritional status Physical activity Advanced directives List of other physicians Hospitalizations, surgeries, and  ER visits in previous 12 months Vitals Screenings to include cognitive, depression, and falls Referrals and appointments  In addition, I have reviewed and discussed with patient certain preventive protocols, quality metrics, and best practice recommendations. A written personalized care plan for preventive services as well as general preventive health recommendations were provided to patient.     Sherry Leonard, CMA   01/26/2023   After Visit Summary: (Mail) Due to this being a telephonic visit, the after visit summary with patients personalized plan was offered to patient via mail   Nurse Notes: Patient requesting a sleep study be ordered, states she was told she needs an EEG. Also, requested a sooner appointment to discuss her chronic pain that she was unable to address at her visit on 01/25/2023.

## 2023-01-26 NOTE — Patient Instructions (Signed)
Sherry Leonard , Thank you for taking time to come for your Medicare Wellness Visit. I appreciate your ongoing commitment to your health goals. Please review the following plan we discussed and let me know if I can assist you in the future.   These are the goals we discussed:  Goals       Patient Stated (pt-stated)      Get her pain under control, figure out what is going on inside her body        This is a list of the screening recommended for you and due dates:  Health Maintenance  Topic Date Due   Pap with HPV screening  Never done   Cologuard (Stool DNA test)  Never done   Screening for Lung Cancer  Never done   Mammogram  Never done   COVID-19 Vaccine (1 - 2024-25 season) 02/10/2023*   Zoster (Shingles) Vaccine (1 of 2) 04/25/2023*   Flu Shot  05/14/2023*   DTaP/Tdap/Td vaccine (1 - Tdap) 01/25/2024*   Medicare Annual Wellness Visit  01/26/2024   Hepatitis C Screening  Completed   HIV Screening  Completed   HPV Vaccine  Aged Out  *Topic was postponed. The date shown is not the original due date.   Sherry Leonard , Thank you for taking time to come for your Medicare Wellness Visit. I appreciate your ongoing commitment to your health goals. Please review the following plan we discussed and let me know if I can assist you in the future.   Referrals/Orders/Follow-Ups/Clinician Recommendations: Follow up with PCP to discuss constant pain you mentioned today.  This is a list of the screening recommended for you and due dates:  Health Maintenance  Topic Date Due   Pap with HPV screening  Never done   Cologuard (Stool DNA test)  Never done   Screening for Lung Cancer  Never done   Mammogram  Never done   COVID-19 Vaccine (1 - 2024-25 season) 02/10/2023*   Zoster (Shingles) Vaccine (1 of 2) 04/25/2023*   Flu Shot  05/14/2023*   DTaP/Tdap/Td vaccine (1 - Tdap) 01/25/2024*   Medicare Annual Wellness Visit  01/26/2024   Hepatitis C Screening  Completed   HIV Screening  Completed   HPV  Vaccine  Aged Out  *Topic was postponed. The date shown is not the original due date.    Advanced directives: (ACP Link)Information on Advanced Care Planning can be found at [facility] of [facility] Advance Health Care Directives Advance Health Care Directives ([email or web address]   Next Medicare Annual Wellness Visit scheduled for next year: No  Preventive Care 13-51 Years Old, Female Preventive care refers to lifestyle choices and visits with your health care provider that can promote health and wellness. Preventive care visits are also called wellness exams. What can I expect for my preventive care visit? Counseling Your health care provider may ask you questions about your: Medical history, including: Past medical problems. Family medical history. Pregnancy history. Current health, including: Menstrual cycle. Method of birth control. Emotional well-being. Home life and relationship well-being. Sexual activity and sexual health. Lifestyle, including: Alcohol, nicotine or tobacco, and drug use. Access to firearms. Diet, exercise, and sleep habits. Work and work Astronomer. Sunscreen use. Safety issues such as seatbelt and bike helmet use. Physical exam Your health care provider will check your: Height and weight. These may be used to calculate your BMI (body mass index). BMI is a measurement that tells if you are at a healthy weight. Waist circumference. This  measures the distance around your waistline. This measurement also tells if you are at a healthy weight and may help predict your risk of certain diseases, such as type 2 diabetes and high blood pressure. Heart rate and blood pressure. Body temperature. Skin for abnormal spots. What immunizations do I need?  Vaccines are usually given at various ages, according to a schedule. Your health care provider will recommend vaccines for you based on your age, medical history, and lifestyle or other factors, such as travel or  where you work. What tests do I need? Screening Your health care provider may recommend screening tests for certain conditions. This may include: Lipid and cholesterol levels. Diabetes screening. This is done by checking your blood sugar (glucose) after you have not eaten for a while (fasting). Pelvic exam and Pap test. Hepatitis B test. Hepatitis C test. HIV (human immunodeficiency virus) test. STI (sexually transmitted infection) testing, if you are at risk. Lung cancer screening. Colorectal cancer screening. Mammogram. Talk with your health care provider about when you should start having regular mammograms. This may depend on whether you have a family history of breast cancer. BRCA-related cancer screening. This may be done if you have a family history of breast, ovarian, tubal, or peritoneal cancers. Bone density scan. This is done to screen for osteoporosis. Talk with your health care provider about your test results, treatment options, and if necessary, the need for more tests. Follow these instructions at home: Eating and drinking  Eat a diet that includes fresh fruits and vegetables, whole grains, lean protein, and low-fat dairy products. Take vitamin and mineral supplements as recommended by your health care provider. Do not drink alcohol if: Your health care provider tells you not to drink. You are pregnant, may be pregnant, or are planning to become pregnant. If you drink alcohol: Limit how much you have to 0-1 drink a day. Know how much alcohol is in your drink. In the [place] one drink equals one 12 oz bottle of beer (355 mL), one 5 oz glass of wine (148 mL), or one 1 oz glass of hard liquor (44 mL). Lifestyle Brush your teeth every morning and night with fluoride toothpaste. Floss one time each day. Exercise for at least 30 minutes 5 or more days each week. Do not use any products that contain nicotine or tobacco. These products include cigarettes, chewing tobacco, and  vaping devices, such as e-cigarettes. If you need help quitting, ask your health care provider. Do not use drugs. If you are sexually active, practice safe sex. Use a condom or other form of protection to prevent STIs. If you do not wish to become pregnant, use a form of birth control. If you plan to become pregnant, see your health care provider for a prepregnancy visit. Take aspirin only as told by your health care provider. Make sure that you understand how much to take and what form to take. Work with your health care provider to find out whether it is safe and beneficial for you to take aspirin daily. Find healthy ways to manage stress, such as: Meditation, yoga, or listening to music. Journaling. Talking to a trusted person. Spending time with friends and family. Minimize exposure to UV radiation to reduce your risk of skin cancer. Safety Always wear your seat belt while driving or riding in a vehicle. Do not drive: If you have been drinking alcohol. Do not ride with someone who has been drinking. When you are tired or distracted. While texting. If you  have been using any mind-altering substances or drugs. Wear a helmet and other protective equipment during sports activities. If you have firearms in your house, make sure you follow all gun safety procedures. Seek help if you have been physically or sexually abused. What's next? Visit your health care provider once a year for an annual wellness visit. Ask your health care provider how often you should have your eyes and teeth checked. Stay up to date on all vaccines. This information is not intended to replace advice given to you by your health care provider. Make sure you discuss any questions you have with your health care provider. Document Revised: 07/28/2020 Document Reviewed: 07/28/2020 Elsevier Patient Education  2024 [employer] insert Preventive Care Attachment Reference

## 2023-01-29 ENCOUNTER — Encounter (HOSPITAL_BASED_OUTPATIENT_CLINIC_OR_DEPARTMENT_OTHER): Payer: Self-pay | Admitting: Family Medicine

## 2023-01-29 ENCOUNTER — Ambulatory Visit (INDEPENDENT_AMBULATORY_CARE_PROVIDER_SITE_OTHER): Payer: Medicare HMO | Admitting: Family Medicine

## 2023-01-29 VITALS — BP 105/72 | HR 79 | Ht 66.0 in | Wt 176.8 lb

## 2023-01-29 DIAGNOSIS — M545 Low back pain, unspecified: Secondary | ICD-10-CM | POA: Diagnosis not present

## 2023-01-29 DIAGNOSIS — S161XXD Strain of muscle, fascia and tendon at neck level, subsequent encounter: Secondary | ICD-10-CM

## 2023-01-29 DIAGNOSIS — G8929 Other chronic pain: Secondary | ICD-10-CM

## 2023-01-29 MED ORDER — CYCLOBENZAPRINE HCL 10 MG PO TABS: 10.0000 mg | ORAL_TABLET | Freq: Three times a day (TID) | ORAL | 0 refills | Status: AC | PRN
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
## 2023-01-29 NOTE — Patient Instructions (Signed)
Please use ice, heat, and  stretches provided for pain relief and increasing your range of motion.

## 2023-01-31 ENCOUNTER — Ambulatory Visit: Payer: Medicare HMO | Admitting: Physical Medicine and Rehabilitation

## 2023-01-31 NOTE — Progress Notes (Signed)
Please let Sherry Leonard know her blood counts have improved. She should continue with an iron rich diet.  One of her liver enzymes is slightly elevated and has been for the past year. Does she drink alcohol or have a fatty diet? I would like to repeat her lab fasting and add on a GGT to evaluate for liver disease in approx. 2-3 weeks. Her cholesterol was significantly elevated and her A1C indicated prediabetes as well and she will need to make dietary changes including avoiding greasy/fatty foods. Adhere to a diet with lean proteins, vegetables, fruits, and low carbohydrates. Drink plenty of water. Regular exercise. We will repeat lipids and A1c in 4 months at her appt. Starting medication such as Metformin could also help if she is interested.

## 2023-02-05 ENCOUNTER — Telehealth (HOSPITAL_BASED_OUTPATIENT_CLINIC_OR_DEPARTMENT_OTHER): Payer: Self-pay | Admitting: Family Medicine

## 2023-02-05 ENCOUNTER — Encounter: Payer: Self-pay | Admitting: Physical Medicine and Rehabilitation

## 2023-02-05 ENCOUNTER — Other Ambulatory Visit (HOSPITAL_BASED_OUTPATIENT_CLINIC_OR_DEPARTMENT_OTHER): Payer: Self-pay | Admitting: Family Medicine

## 2023-02-05 ENCOUNTER — Ambulatory Visit: Payer: Medicare HMO | Admitting: Physical Medicine and Rehabilitation

## 2023-02-05 DIAGNOSIS — M5416 Radiculopathy, lumbar region: Secondary | ICD-10-CM

## 2023-02-05 DIAGNOSIS — M5442 Lumbago with sciatica, left side: Secondary | ICD-10-CM

## 2023-02-05 DIAGNOSIS — M5441 Lumbago with sciatica, right side: Secondary | ICD-10-CM

## 2023-02-05 DIAGNOSIS — R748 Abnormal levels of other serum enzymes: Secondary | ICD-10-CM

## 2023-02-05 DIAGNOSIS — G8929 Other chronic pain: Secondary | ICD-10-CM

## 2023-02-05 DIAGNOSIS — G894 Chronic pain syndrome: Secondary | ICD-10-CM | POA: Diagnosis not present

## 2023-02-05 NOTE — Telephone Encounter (Signed)
Called and spoke with pt to discuss the results of recent labwork. Stated to pt that her liver enzymes were elevated and when I asked her if she drank alcohol, patient would never give me a response and kept trying to ask me what could make the liver enzymes elevated. I again stated to pt that drinking alcohol or eating fatty foods could contribute to liver enzymes being elevated and again when I asked her if she drank alcohol, patient again would neger give me an answer.   Stated to pt that we were going to repeat labs in 2-3 weeks and she verbalized understanding. Patient said she would need a reminder call due to labs needing to be fasting to remind her not to drink sodas the morning she comes for the labs. Reminder set for me to call patient to remind her about the fasting labs. Routing to [employer] as an Financial planner.
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
## 2023-02-12 ENCOUNTER — Ambulatory Visit
Admission: RE | Admit: 2023-02-12 | Discharge: 2023-02-12 | Disposition: A | Discharge status: Home / Self Care | Payer: Medicare HMO | Source: Ambulatory Visit | Attending: Family Medicine | Admitting: Family Medicine

## 2023-02-12 DIAGNOSIS — N643 Galactorrhea not associated with childbirth: Secondary | ICD-10-CM

## 2023-02-12 DIAGNOSIS — Z1231 Encounter for screening mammogram for malignant neoplasm of breast: Secondary | ICD-10-CM

## 2023-02-19 ENCOUNTER — Ambulatory Visit: Payer: Medicare HMO | Admitting: Physical Therapy

## 2023-02-21 ENCOUNTER — Ambulatory Visit (HOSPITAL_BASED_OUTPATIENT_CLINIC_OR_DEPARTMENT_OTHER): Payer: Medicare HMO

## 2023-02-21 ENCOUNTER — Ambulatory Visit (HOSPITAL_BASED_OUTPATIENT_CLINIC_OR_DEPARTMENT_OTHER): Payer: Medicare HMO | Admitting: Certified Nurse Midwife

## 2023-02-21 VITALS — BP 101/67 | HR 92 | Ht 66.0 in | Wt 181.0 lb

## 2023-02-21 DIAGNOSIS — R140 Abdominal distension (gaseous): Secondary | ICD-10-CM | POA: Diagnosis not present

## 2023-02-21 DIAGNOSIS — N926 Irregular menstruation, unspecified: Secondary | ICD-10-CM | POA: Diagnosis not present

## 2023-02-21 NOTE — Progress Notes (Signed)
  Sherry Leonard is a 52yo who was evaluated 01/09/23. At that time patient was concerned about abdominal and pelvic bloating.CA 125 result was within normal range.  She returned today for US . Pelvic US  today shows a normal appearing uterus, normal in size without evidence of focal abnormalities. EM 8-51mm.Ovaries appear normal bilaterally. Negative adnexal regions bilaterally. Neg cul de sac. No free fluid present. CX wnl. Discussed US  findings (normal/negative) with patient today and she is reassured. She will RTO in one year for annual gyn exam and prn if issues arise.  Arland MARLA Roller

## 2023-02-26 ENCOUNTER — Other Ambulatory Visit (HOSPITAL_BASED_OUTPATIENT_CLINIC_OR_DEPARTMENT_OTHER): Payer: Medicare HMO

## 2023-02-28 ENCOUNTER — Other Ambulatory Visit: Payer: Medicare HMO

## 2023-03-15 ENCOUNTER — Ambulatory Visit: Payer: Medicare HMO | Admitting: Physical Therapy

## 2023-03-24 ENCOUNTER — Other Ambulatory Visit: Payer: Self-pay

## 2023-03-24 ENCOUNTER — Emergency Department (HOSPITAL_BASED_OUTPATIENT_CLINIC_OR_DEPARTMENT_OTHER): Payer: Medicare HMO | Admitting: Radiology

## 2023-03-24 ENCOUNTER — Emergency Department (HOSPITAL_BASED_OUTPATIENT_CLINIC_OR_DEPARTMENT_OTHER)
Admission: EM | Admit: 2023-03-24 | Discharge: 2023-03-24 | Disposition: A | Discharge status: Home / Self Care | Payer: Medicare HMO | Attending: Emergency Medicine | Admitting: Emergency Medicine

## 2023-03-24 DIAGNOSIS — J019 Acute sinusitis, unspecified: Secondary | ICD-10-CM | POA: Diagnosis not present

## 2023-03-24 DIAGNOSIS — H66003 Acute suppurative otitis media without spontaneous rupture of ear drum, bilateral: Secondary | ICD-10-CM

## 2023-03-24 DIAGNOSIS — M791 Myalgia, unspecified site: Secondary | ICD-10-CM | POA: Diagnosis present

## 2023-03-24 DIAGNOSIS — Z20822 Contact with and (suspected) exposure to covid-19: Secondary | ICD-10-CM | POA: Insufficient documentation

## 2023-03-24 DIAGNOSIS — R509 Fever, unspecified: Secondary | ICD-10-CM | POA: Diagnosis present

## 2023-03-24 DIAGNOSIS — R52 Pain, unspecified: Secondary | ICD-10-CM

## 2023-03-24 DIAGNOSIS — R1084 Generalized abdominal pain: Secondary | ICD-10-CM

## 2023-03-24 DIAGNOSIS — R5383 Other fatigue: Secondary | ICD-10-CM

## 2023-03-24 LAB — RESP PANEL BY RT-PCR (RSV, FLU A&B, COVID)  RVPGX2
Influenza A by PCR: NEGATIVE
Influenza B by PCR: NEGATIVE
Resp Syncytial Virus by PCR: NEGATIVE
SARS Coronavirus 2 by RT PCR: NEGATIVE

## 2023-03-24 LAB — URINALYSIS, ROUTINE W REFLEX MICROSCOPIC
Bilirubin Urine: NEGATIVE
Glucose, UA: NEGATIVE mg/dL
Hgb urine dipstick: NEGATIVE
Ketones, ur: NEGATIVE mg/dL
Leukocytes,Ua: NEGATIVE
Nitrite: NEGATIVE
Specific Gravity, Urine: 1.028 (ref 1.005–1.030)
pH: 6 (ref 5.0–8.0)

## 2023-03-24 LAB — COMPREHENSIVE METABOLIC PANEL
ALT: 30 U/L (ref 0–44)
AST: 23 U/L (ref 15–41)
Albumin: 4.3 g/dL (ref 3.5–5.0)
Alkaline Phosphatase: 156 U/L — ABNORMAL HIGH (ref 38–126)
Anion gap: 8 (ref 5–15)
BUN: 9 mg/dL (ref 6–20)
CO2: 26 mmol/L (ref 22–32)
Calcium: 9.1 mg/dL (ref 8.9–10.3)
Chloride: 104 mmol/L (ref 98–111)
Creatinine, Ser: 1 mg/dL (ref 0.44–1.00)
GFR, Estimated: >60 mL/min (ref >60)
Glucose, Bld: 87 mg/dL (ref 70–99)
Potassium: 3.6 mmol/L (ref 3.5–5.1)
Sodium: 138 mmol/L (ref 135–145)
Total Bilirubin: 0.3 mg/dL (ref 0.0–1.2)
Total Protein: 7.6 g/dL (ref 6.5–8.1)

## 2023-03-24 LAB — CBC
HCT: 39 % (ref 36.0–46.0)
Hemoglobin: 12.4 g/dL (ref 12.0–15.0)
MCH: 25 pg — ABNORMAL LOW (ref 26.0–34.0)
MCHC: 31.8 g/dL (ref 30.0–36.0)
MCV: 78.6 fL — ABNORMAL LOW (ref 80.0–100.0)
Platelets: 227 10*3/uL (ref 150–400)
RBC: 4.96 MIL/uL (ref 3.87–5.11)
RDW: 14.9 % (ref 11.5–15.5)
WBC: 7.1 10*3/uL (ref 4.0–10.5)
nRBC: 0 % (ref 0.0–0.2)

## 2023-03-24 LAB — PREGNANCY, URINE: Preg Test, Ur: NEGATIVE

## 2023-03-24 LAB — LIPASE, BLOOD: Lipase: 24 U/L (ref 11–51)

## 2023-03-24 LAB — TROPONIN I (HIGH SENSITIVITY): Troponin I (High Sensitivity): <2 ng/L (ref <18)

## 2023-03-24 MED ORDER — ALBUTEROL SULFATE HFA 108 (90 BASE) MCG/ACT IN AERS: 2.0000 | INHALATION_SPRAY | RESPIRATORY_TRACT | Status: DC | PRN

## 2023-03-24 MED ORDER — AMOXICILLIN-POT CLAVULANATE 875-125 MG PO TABS: 1.0000 | ORAL_TABLET | Freq: Two times a day (BID) | ORAL | 0 refills | Status: AC

## 2023-03-24 NOTE — ED Provider Notes (Signed)
 [place] EMERGENCY DEPARTMENT AT [facility] Provider Note   CSN: [id number] Arrival date & time: 03/24/23  1729     History  Chief Complaint  Patient presents with   Fever   Nasal Congestion    Sherry Leonard is a 53 y.o. female.  HPI     53 year old female with a history of narcolepsy, ovarian cyst, depression, anxiety, chronic back pain who presents with concern for body aches, abdominal pain, headache, congestion.   Body aches, stomach pain, stomach pain got better, when it came back had headache, congestion, runny nose, stuffed up pains everywhere  Started feeling sick about 2-3 weeks ago. Was sick, got better, then came back again. Worse today.  Took tylenol  cold and flu, took theraflu.  Didn't feel any better. When came back this time was worse, stomach pain, headache body aches.  Whole abdomen hurting.  At first vomiting, that improved. Not vomiting now.  Had diarrhea throat sore ears itching.   Abd pain, back pain.  No urinary symptoms.  No vaginal bleeding or discharge.    Blowing nose so much nose is sore, yellow mucus.  Fever at [facility] today, not sure how high it was.  Didn't take with thermometer  Past Medical History:  Diagnosis Date   Allergy    Anxiety    Arthritis    Depression    Hypokalemia 02/23/2022   Hyponatremia 02/23/2022   Narcolepsy    Ovarian cyst 01/02/2023   4cm ovarian cyst 01/02/23   Pyelonephritis 02/23/2022   Sepsis (HCC) 02/23/2022   Transaminitis 02/23/2022     Home Medications Prior to Admission medications   Medication Sig Start Date End Date Taking? Authorizing Provider  amoxicillin -clavulanate (AUGMENTIN ) 875-125 MG tablet Take 1 tablet by mouth every 12 (twelve) hours for 7 days. 03/24/23 03/31/23 Yes Dreama Longs, MD  acetaminophen  (TYLENOL ) 500 MG tablet Take 1,000 mg by mouth every 6 (six) hours as needed for mild pain or fever.    [provider]  cyclobenzaprine  (FLEXERIL ) 10 MG tablet Take 1 tablet (10 mg  total) by mouth 3 (three) times daily as needed for muscle spasms. 01/29/23   Caudle, Thersia Bitters, FNP      Allergies    Aspirin    Review of Systems   Review of Systems  Physical Exam Updated Vital Signs BP 100/70 (BP Location: Right Arm)   Pulse 77   Temp 98.3 F (36.8 C) (Oral)   Resp 17   Ht 5' 5 (1.651 m)   Wt 81.6 kg   SpO2 100%   BMI 29.95 kg/m  Physical Exam Vitals and nursing note reviewed.  Constitutional:      General: She is not in acute distress.    Appearance: Normal appearance. She is well-developed. She is not ill-appearing or diaphoretic.  HENT:     Head: Normocephalic and atraumatic.     Right Ear: Tympanic membrane is bulging.     Left Ear: Tympanic membrane is bulging.  Eyes:     General: No visual field deficit.    Extraocular Movements: Extraocular movements intact.     Conjunctiva/sclera: Conjunctivae normal.     Pupils: Pupils are equal, round, and reactive to light.  Cardiovascular:     Rate and Rhythm: Normal rate and regular rhythm.     Pulses: Normal pulses.     Heart sounds: Normal heart sounds. No murmur heard.    No friction rub. No gallop.  Pulmonary:     Effort: Pulmonary effort is  normal. No respiratory distress.     Breath sounds: Normal breath sounds. No wheezing or rales.  Abdominal:     General: There is no distension.     Palpations: Abdomen is soft.     Tenderness: There is no abdominal tenderness. There is no guarding.  Musculoskeletal:        General: No swelling or tenderness.     Cervical back: Normal range of motion.  Skin:    General: Skin is warm and dry.     Findings: No erythema or rash.  Neurological:     General: No focal deficit present.     Mental Status: She is alert and oriented to person, place, and time.     GCS: GCS eye subscore is 4. GCS verbal subscore is 5. GCS motor subscore is 6.     Cranial Nerves: No cranial nerve deficit, dysarthria or facial asymmetry.     Sensory: No sensory deficit.      Motor: No weakness or tremor.     Coordination: Coordination normal. Finger-Nose-Finger Test normal.     Gait: Gait normal.     ED Results / Procedures / Treatments   Labs (all labs ordered are listed, but only abnormal results are displayed) Labs Reviewed  COMPREHENSIVE METABOLIC PANEL - Abnormal; Notable for the following components:      Result Value   Alkaline Phosphatase 156 (*)    All other components within normal limits  CBC - Abnormal; Notable for the following components:   MCV 78.6 (*)    MCH 25.0 (*)    All other components within normal limits  URINALYSIS, ROUTINE W REFLEX MICROSCOPIC - Abnormal; Notable for the following components:   Protein, ur TRACE (*)    All other components within normal limits  RESP PANEL BY RT-PCR (RSV, FLU A&B, COVID)  RVPGX2  LIPASE, BLOOD  PREGNANCY, URINE  TROPONIN I (HIGH SENSITIVITY)    EKG EKG Interpretation Date/Time:  Saturday March 24 2023 18:32:23 EST Ventricular Rate:  93 PR Interval:  125 QRS Duration:  76 QT Interval:  331 QTC Calculation: 412 R Axis:   55  Text Interpretation: Sinus rhythm No previous ECGs available Confirmed by Dreama Longs ([id number]) on 03/24/2023 8:04:02 PM  Radiology DG Chest 2 View Result Date: 03/24/2023 CLINICAL DATA:  Shortness of breath, fever EXAM: CHEST - 2 VIEW COMPARISON:  None Available. FINDINGS: The heart size and mediastinal contours are within normal limits. Both lungs are clear. The visualized skeletal structures are unremarkable. IMPRESSION: No active cardiopulmonary disease. Electronically Signed   By: Franky Crease M.D.   On: 03/24/2023 18:15    Procedures Procedures    Medications Ordered in ED Medications - No data to display   ED Course/ Medical Decision Making/ A&P                                  53 year old female with a history of narcolepsy, ovarian cyst, depression, anxiety, chronic back pain who presents with concern for body aches, abdominal pain, headache,  congestion.    EKG completed and personally interpreted me by me shows no acute ST changes or signs of pericarditis.  Chest x-ray completed and personally about interpreted by me and shows no acute abnormalities.  Labs completed and personally about interpreted by me show normal lipase, mildly elevated alk phos without other acute abnormalities on CMP, no clinically significant electrolyte abnormalities, no anemia, no leukocytosis,  no sign of UTI.  Pregnancy test is negative.  Troponin is negative have low suspicion for ACS.  COVID, influenza and RSV testing are negative.  Regarding dyspnea/fatigue: Low clinical suspicion for PE, ACS, pneumonia, pneumothorax, anemia, metabolic abnormality by hx, exam, labs, CXR. Describes dyspnea more as difficulty breathing from nasal congestion.   Headache began slowly, no trauma, no meningeal signs and normal neurologic exam and have low suspicion for [facility], SDH or bacterial meningitis.   No focal findings on abdominal exam, doubt appendicitis, diverticulitis, cholecystitis, UTI, pancreatitis, hepatitis, SBO, nephrolithiasis, by hx, exam, labs.  Has had yellow nasal congestion over last 2 weeks, signs otitis media with ear pain, and suspect bacterial sinusitis, otitis, and these may be etiology of systemic symptoms. Denies IVDU, hx of other bacteremia, afebrile and no leukocytosis and doubt serious bacterial infection.  Given rx for augmentin  for sinusitis. Recommend PCP follow up and discussed reasons to return.       Final Clinical Impression(s) / ED Diagnoses Final diagnoses:  Acute sinusitis, recurrence not specified, unspecified location  Non-recurrent acute suppurative otitis media of both ears without spontaneous rupture of tympanic membranes  Generalized abdominal pain  Other fatigue  Body aches    Rx / DC Orders ED Discharge Orders          Ordered    amoxicillin -clavulanate (AUGMENTIN ) 875-125 MG tablet  Every 12 hours        03/24/23  2159              Dreama Longs, MD 03/26/23 1058

## 2023-03-24 NOTE — ED Triage Notes (Signed)
 Pt POV from home reporting flu-like sx past few days. Endorses SOB with exertion.

## 2023-04-25 ENCOUNTER — Ambulatory Visit (HOSPITAL_BASED_OUTPATIENT_CLINIC_OR_DEPARTMENT_OTHER): Payer: Medicare HMO | Admitting: Family Medicine

## 2023-05-07 ENCOUNTER — Other Ambulatory Visit (HOSPITAL_BASED_OUTPATIENT_CLINIC_OR_DEPARTMENT_OTHER): Payer: Self-pay | Admitting: Family Medicine

## 2023-05-07 DIAGNOSIS — D509 Iron deficiency anemia, unspecified: Secondary | ICD-10-CM

## 2023-05-07 DIAGNOSIS — R748 Abnormal levels of other serum enzymes: Secondary | ICD-10-CM

## 2023-05-07 DIAGNOSIS — E785 Hyperlipidemia, unspecified: Secondary | ICD-10-CM | POA: Insufficient documentation

## 2023-05-07 DIAGNOSIS — R7303 Prediabetes: Secondary | ICD-10-CM | POA: Insufficient documentation

## 2023-05-21 ENCOUNTER — Ambulatory Visit (HOSPITAL_BASED_OUTPATIENT_CLINIC_OR_DEPARTMENT_OTHER): Admitting: Family Medicine

## 2023-05-31 ENCOUNTER — Ambulatory Visit (HOSPITAL_BASED_OUTPATIENT_CLINIC_OR_DEPARTMENT_OTHER): Admitting: Family Medicine

## 2023-06-11 ENCOUNTER — Ambulatory Visit (HOSPITAL_BASED_OUTPATIENT_CLINIC_OR_DEPARTMENT_OTHER): Admitting: Family Medicine

## 2023-06-11 ENCOUNTER — Encounter (HOSPITAL_BASED_OUTPATIENT_CLINIC_OR_DEPARTMENT_OTHER): Payer: Self-pay | Admitting: Family Medicine

## 2023-06-11 VITALS — BP 111/82 | HR 74 | Ht 65.0 in | Wt 182.4 lb

## 2023-06-11 DIAGNOSIS — N951 Menopausal and female climacteric states: Secondary | ICD-10-CM

## 2023-06-11 DIAGNOSIS — R7303 Prediabetes: Secondary | ICD-10-CM | POA: Diagnosis not present

## 2023-06-11 DIAGNOSIS — Z122 Encounter for screening for malignant neoplasm of respiratory organs: Secondary | ICD-10-CM

## 2023-06-11 DIAGNOSIS — R748 Abnormal levels of other serum enzymes: Secondary | ICD-10-CM | POA: Diagnosis not present

## 2023-06-11 DIAGNOSIS — D509 Iron deficiency anemia, unspecified: Secondary | ICD-10-CM | POA: Diagnosis not present

## 2023-06-11 DIAGNOSIS — E785 Hyperlipidemia, unspecified: Secondary | ICD-10-CM | POA: Diagnosis not present

## 2023-06-11 DIAGNOSIS — G4733 Obstructive sleep apnea (adult) (pediatric): Secondary | ICD-10-CM | POA: Insufficient documentation

## 2023-06-11 MED ORDER — VENLAFAXINE HCL ER 37.5 MG PO CP24
37.5000 mg | ORAL_CAPSULE | Freq: Every day | ORAL | 3 refills | Status: AC
Start: 2023-06-11 — End: ?

## 2023-06-11 NOTE — Patient Instructions (Addendum)
 Please return for fasting blood work.  For fasting, if your blood work is in the morning please do not eat any food after midnight.  You may have water or black coffee prior to your lab work.  Please take all regularly prescribed medications even if you are fasting.  If your blood work is in the afternoon, please fast for at least 5 to 6 hours.  You may continue to drink water and/or black coffee prior to your lab work.  Please take all scheduled medications even if you are fasting.  Lab is open M-F from 0800am- 11:30am and 1:00pm-4:30pm

## 2023-06-11 NOTE — Progress Notes (Signed)
 Subjective:   Sherry Leonard [birth year] 06/11/2023  Chief Complaint  Patient presents with   Follow-up    Follow up -having night sweats    HPI: Sherry Leonard presents today for re-assessment and management of chronic medical conditions.   IMPAIRED FASTING GLUCOSE Sherry Leonard is here for medical management of impaired fasting glucose.  Patient's current IFG medication regimen is: Diet- patient states she has not made dietary changes.  Adhering to a diabetic diet: None Exercising Regularly: None Checking Blood Sugars: No  Denies polydipsia, polyphagia, polyuria.  Lab Results  Component Value Date   HGBA1C 6.2 (H) 01/25/2023    ELEVATED ALK PHOS LEVEL:  Patient was advised due to dyslipidemia and IFG to start dietary changes due to elevated Alk Phos, A1C, and Lipids. She denies frequent alcohol use. She is a smoker of 2ppd and reports hx of OSA. Patient reports she has not made any dietary changes since last visit. She does not exercise.      Latest Ref Rng & Units 03/24/2023    5:56 PM 01/25/2023   11:21 AM 01/09/2023   11:00 AM  Hepatic Function  Total Protein 6.5 - 8.1 g/dL 7.6  7.3  7.6   Albumin 3.5 - 5.0 g/dL 4.3  4.4  4.6   AST 15 - 41 U/L 23  18  17    ALT 0 - 44 U/L 30  20  18    Alk Phosphatase 38 - 126 U/L 156  153  164   Total Bilirubin 0.0 - 1.2 mg/dL 0.3  0.2  0.3     IDA:  Sherry Leonard presents for the medical management of Anemia.  Current medication : Dietary sources of iron   Denies bloody stools, hematuria, excessive fatigue, palpitations, pica.  Patient is not seeing hematology for management.   Lab Results  Component Value Date   WBC 7.1 03/24/2023   HGB 12.4 03/24/2023   HCT 39.0 03/24/2023   MCV 78.6 (L) 03/24/2023   PLT 227 03/24/2023    Lab Results  Component Value Date   IRON 41 02/24/2022   TIBC 193 (L) 02/24/2022   FERRITIN 1,967 (H) 02/24/2022     MENOPAUSAL SYMPTOMS Onset: Over 1 year   Patient reports uncontrolled emotional  lability and frequent hot flashes during the day and night.  Patient is wanting to trial medication therapy for PMS symptoms.  She denies abnormal uterine bleeding.  She is a daily tobacco user.  Hot flashes: yes Night sweats: yes Sleep disturbances: yes Vaginal dryness: no Dyspareunia:no Decreased libido: no Emotional lability: yes Irregular Menstrual cycles: no    SLEEP APNEA: Sherry Leonard presents for the medical management of OSA. Patient states she was diagnosed with sleep apnea and currently wakes up choking. She states she does not wear a CPAP. She is unable to recall when her last sleep study was done.   Daytime hypersomnolence:  yes Fatigue:  yes Insomnia:  yes Good sleep hygiene:  yes Snoring:  yes Observed apnea by bed partner: yes   The following portions of the patient's history were reviewed and updated as appropriate: past medical history, past surgical history, family history, social history, allergies, medications, and problem list.   Patient Active Problem List   Diagnosis Date Noted   Elevated alkaline phosphatase level 06/11/2023   Obstructive sleep apnea 06/11/2023   Prediabetes 05/07/2023   Dyslipidemia 05/07/2023   Overweight (BMI 25.0-29.9) 01/25/2023   Polyphagia 01/25/2023   Galactorrhea of both breasts 01/09/2023  Abdominal bloating 01/09/2023   Vasomotor symptoms due to menopause 01/09/2023   Microcytic hypochromic anemia 02/23/2022   Hyperbilirubinemia 02/23/2022   Primary narcolepsy without cataplexy 04/18/2017   Chronic fatigue 04/18/2017   Severe depression (HCC) 04/18/2017   Tobacco use disorder, severe, dependence 04/18/2017   Chronic rhinitis 04/18/2017   Past Medical History:  Diagnosis Date   Allergy    Anxiety    Arthritis    Depression    Hypokalemia 02/23/2022   Hyponatremia 02/23/2022   Narcolepsy    Ovarian cyst 01/02/2023   4cm ovarian cyst 01/02/23   Pyelonephritis 02/23/2022   Sepsis (HCC) 02/23/2022   Transaminitis  02/23/2022   Past Surgical History:  Procedure Laterality Date   TUBAL LIGATION     Family History  Problem Relation Age of Onset   Cancer Maternal Grandfather    Outpatient Medications Prior to Visit  Medication Sig Dispense Refill   acetaminophen  (TYLENOL ) 500 MG tablet Take 1,000 mg by mouth every 6 (six) hours as needed for mild pain or fever.     cyclobenzaprine  (FLEXERIL ) 10 MG tablet Take 1 tablet (10 mg total) by mouth 3 (three) times daily as needed for muscle spasms. 30 tablet 0   No facility-administered medications prior to visit.   Allergies  Allergen Reactions   Aspirin     Itch Able to tolerate Tylenol      ROS: A complete ROS was performed with pertinent positives/negatives noted in the HPI. The remainder of the ROS are negative.    Objective:   Today's Vitals   06/11/23 0935  BP: 111/82  Pulse: 74  SpO2: 100%  Weight: 182 lb 6.4 oz (82.7 kg)  Height: 5\' 5"  (1.651 m)  PainSc: 8   PainLoc: Back    Physical Exam          GENERAL: Well-appearing, in NAD. Well nourished.  SKIN: Pink, warm and dry.  Head: Normocephalic. NECK: Trachea midline. Full ROM w/o pain or tenderness.  RESPIRATORY: Chest wall symmetrical. Respirations even and non-labored. Breath sounds clear to auscultation bilaterally.  CARDIAC: S1, S2 present, regular rate and rhythm without murmur or gallops. Peripheral pulses 2+ bilaterally.  MSK: Muscle tone and strength appropriate for age NEUROLOGIC: No motor or sensory deficits. Steady, even gait. C2-C12 intact.  PSYCH/MENTAL STATUS: Alert, oriented x 3. Cooperative, appropriate mood and affect.   Health Maintenance Due  Topic Date Due   Pneumococcal Vaccine 57-4 Years old (1 of 2 - PCV) Never done   Cervical Cancer Screening (HPV/Pap Cotest)  Never done   Lung Cancer Screening  Never done   Zoster Vaccines- Shingrix (1 of 2) Never done   COVID-19 Vaccine (1 - 2024-25 season) Never done    No results found for any visits on  06/11/23.  The 10-year ASCVD risk score (Arnett DK, et al., 2019) is: 4.3%     Assessment & Plan:  1. Prediabetes (Primary) Uncontrolled.  Will assess A1c with fasting lab work in the next week as patient presented after eating this morning.  We discussed prediabetes eating plan, regular exercise to decrease her risk of progression to type 2 diabetes.  I also discussed possible metformin therapy pending A1c.  Will notify patient of results when available - Hemoglobin A1c  2. Elevated alkaline phosphatase level Elevated alk phos likely due to prediabetes, dyslipidemia and inflammation.  We discussed needed dietary changes, right upper quadrant ultrasound from 2024 reviewed without signs of fatty liver disease that are contributing.  Will assess with GGT  level as part of lab work and complete plan of care will be provided to patient pending labs. - Gamma GT; Future - Gamma GT - Hepatic function panel  3. Dyslipidemia Uncontrolled.  We discussed dietary changes needed such as heart healthy diet, regular exercise and the importance of smoking cessation.  Will check lipid panel with fasting lab work and if needed, start statin therapy. - Lipid panel  4. Microcytic hypochromic anemia Controlled with diet.  Will check iron panel and CBC with lab work per patient request. - Iron, TIBC and Ferritin Panel - CBC with Differential/Platelet  5. Encounter for screening for lung cancer Discussed benefits and risks of lung cancer screening and patient was agreeable.  She previously completed lung cancer CT screening with [facility] in April 2024 with a recommendation to repeat in 1 year. - CT CHEST LUNG CA SCREEN LOW DOSE W/O CM; Future  6. Vasomotor symptoms due to menopause Start venlafaxine given smoking status and not a candidate for hormonal therapy at this time.  We will start Effexor 37.5 mg daily and titrate as needed.  Discussed safe use of this medication and possible side effects and  patient verbalized understanding. - venlafaxine XR (EFFEXOR-XR) 37.5 MG 24 hr capsule; Take 1 capsule (37.5 mg total) by mouth daily with breakfast.  Dispense: 30 capsule; Refill: 3  7. Obstructive sleep apnea Uncontrolled.  Patient has never worn CPAP device from HPI and recommend sleep would greatly benefit as well as reducing her CVD risk.  Patient was agreeable and referral placed to pulmonology to discuss options for CPAP for OSA. - Ambulatory referral to Pulmonology  Meds ordered this encounter  Medications   venlafaxine XR (EFFEXOR-XR) 37.5 MG 24 hr capsule    Sig: Take 1 capsule (37.5 mg total) by mouth daily with breakfast.    Dispense:  30 capsule    Refill:  3    Supervising Provider:   DE Peru, RAYMOND J [[id number]]   Lab Orders         Gamma GT      Return in about 6 months (around 12/11/2023) for F/u HLD, DM, Anemia, and VMS Symptoms .    Patient to reach out to office if new, worrisome, or unresolved symptoms arise or if no improvement in patient's condition. Patient verbalized understanding and is agreeable to treatment plan. All questions answered to patient's satisfaction.    Nonda Bays, [place]

## 2023-06-21 ENCOUNTER — Emergency Department (HOSPITAL_BASED_OUTPATIENT_CLINIC_OR_DEPARTMENT_OTHER)
Admission: EM | Admit: 2023-06-21 | Discharge: 2023-06-21 | Disposition: A | Discharge status: Home / Self Care | Attending: Emergency Medicine | Admitting: Emergency Medicine

## 2023-06-21 ENCOUNTER — Other Ambulatory Visit: Payer: Self-pay

## 2023-06-21 ENCOUNTER — Encounter (HOSPITAL_BASED_OUTPATIENT_CLINIC_OR_DEPARTMENT_OTHER): Payer: Self-pay

## 2023-06-21 DIAGNOSIS — M79603 Pain in arm, unspecified: Secondary | ICD-10-CM | POA: Insufficient documentation

## 2023-06-21 DIAGNOSIS — T754XXA Electrocution, initial encounter: Secondary | ICD-10-CM | POA: Insufficient documentation

## 2023-06-21 LAB — CBC
HCT: 37.4 % (ref 36.0–46.0)
Hemoglobin: 12 g/dL (ref 12.0–15.0)
MCH: 24.7 pg — ABNORMAL LOW (ref 26.0–34.0)
MCHC: 32.1 g/dL (ref 30.0–36.0)
MCV: 77.1 fL — ABNORMAL LOW (ref 80.0–100.0)
Platelets: 172 10*3/uL (ref 150–400)
RBC: 4.85 MIL/uL (ref 3.87–5.11)
RDW: 14.6 % (ref 11.5–15.5)
WBC: 7.9 10*3/uL (ref 4.0–10.5)
nRBC: 0 % (ref 0.0–0.2)

## 2023-06-21 LAB — BASIC METABOLIC PANEL WITH GFR
Anion gap: 11 (ref 5–15)
BUN: 5 mg/dL — ABNORMAL LOW (ref 6–20)
CO2: 24 mmol/L (ref 22–32)
Calcium: 9.5 mg/dL (ref 8.9–10.3)
Chloride: 104 mmol/L (ref 98–111)
Creatinine, Ser: 0.86 mg/dL (ref 0.44–1.00)
GFR, Estimated: >60 mL/min (ref >60)
Glucose, Bld: 103 mg/dL — ABNORMAL HIGH (ref 70–99)
Potassium: 4 mmol/L (ref 3.5–5.1)
Sodium: 138 mmol/L (ref 135–145)

## 2023-06-21 LAB — CK: Total CK: 115 U/L (ref 38–234)
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
## 2023-06-21 NOTE — Discharge Instructions (Signed)
 Please use Tylenol  or ibuprofen  for pain.  You may use 600 mg ibuprofen  every 6 hours or 1000 mg of Tylenol  every 6 hours.  You may choose to alternate between the 2.  This would be most effective.  Not to exceed 4 g of Tylenol  within 24 hours.  Not to exceed 3200 mg ibuprofen  24 hours.  Your workup was reassuring today, no additional treatment needed.  The pain should subside over time.
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
## 2023-07-24 ENCOUNTER — Encounter (HOSPITAL_BASED_OUTPATIENT_CLINIC_OR_DEPARTMENT_OTHER): Payer: Self-pay | Admitting: Primary Care

## 2023-07-24 ENCOUNTER — Ambulatory Visit (HOSPITAL_BASED_OUTPATIENT_CLINIC_OR_DEPARTMENT_OTHER): Admitting: Primary Care

## 2023-07-24 NOTE — Progress Notes (Deleted)
 @  Patient ID: Sanskriti Greenlaw, female    DOB: 09-15-1970, 53 y.o.   MRN: [id number]  No chief complaint on file.   Referring provider: Nonda Bays, *  HPI: 53 year old female, current smoker. PMH significant for chronic rhinitis, obstructive sleep apnea, narcolepsy without cataplexy, prediabetes, depression, tobacco abuse.   07/24/2023     Allergies  Allergen Reactions   Aspirin     Itch Able to tolerate Tylenol      There is no immunization history on file for this patient.  Past Medical History:  Diagnosis Date   Allergy    Anxiety    Arthritis    Depression    Hypokalemia 02/23/2022   Hyponatremia 02/23/2022   Narcolepsy    Ovarian cyst 01/02/2023   4cm ovarian cyst 01/02/23   Pyelonephritis 02/23/2022   Sepsis (HCC) 02/23/2022   Transaminitis 02/23/2022    Tobacco History: Social History   Tobacco Use  Smoking Status Every Day   Current packs/day: 2.00   Average packs/day: 2.0 packs/day for 45.4 years (90.9 ttl pk-yrs)   Types: Cigarettes   Start date: 1980   Passive exposure: Current  Smokeless Tobacco Never   Ready to quit: Not Answered Counseling given: Not Answered   Outpatient Medications Prior to Visit  Medication Sig Dispense Refill   acetaminophen  (TYLENOL ) 500 MG tablet Take 1,000 mg by mouth every 6 (six) hours as needed for mild pain or fever.     cyclobenzaprine  (FLEXERIL ) 10 MG tablet Take 1 tablet (10 mg total) by mouth 3 (three) times daily as needed for muscle spasms. 30 tablet 0   venlafaxine  XR (EFFEXOR -XR) 37.5 MG 24 hr capsule Take 1 capsule (37.5 mg total) by mouth daily with breakfast. 30 capsule 3   No facility-administered medications prior to visit.      Review of Systems  Review of Systems   Physical Exam  There were no vitals taken for this visit. Physical Exam   Lab Results:  CBC    Component Value Date/Time   WBC 7.9 06/21/2023 1337   RBC 4.85 06/21/2023 1337   HGB 12.0 06/21/2023 1337   HGB  12.7 01/25/2023 1121   HCT 37.4 06/21/2023 1337   HCT 42.5 01/25/2023 1121   PLT 172 06/21/2023 1337   PLT 184 01/25/2023 1121   MCV 77.1 (L) 06/21/2023 1337   MCV 84 01/25/2023 1121   MCH 24.7 (L) 06/21/2023 1337   MCHC 32.1 06/21/2023 1337   RDW 14.6 06/21/2023 1337   RDW 14.0 01/25/2023 1121   LYMPHSABS 4.3 (H) 01/25/2023 1121   MONOABS 0.5 08/09/2021 1350   EOSABS 0.3 01/25/2023 1121   BASOSABS 0.1 01/25/2023 1121    BMET    Component Value Date/Time   NA 138 06/21/2023 1337   NA 141 01/25/2023 1121   K 4.0 06/21/2023 1337   CL 104 06/21/2023 1337   CO2 24 06/21/2023 1337   GLUCOSE 103 (H) 06/21/2023 1337   BUN 5 (L) 06/21/2023 1337   BUN 12 01/25/2023 1121   CREATININE 0.86 06/21/2023 1337   CALCIUM 9.5 06/21/2023 1337   GFRNONAA >60 06/21/2023 1337   GFRAA 119 04/12/2017 1633    BNP No results found for: "BNP"  ProBNP No results found for: "PROBNP"  Imaging: No results found.   Assessment & Plan:   No problem-specific Assessment & Plan notes found for this encounter.     Antonio Baumgarten, NP 07/24/2023

## 2023-08-25 ENCOUNTER — Other Ambulatory Visit: Payer: Self-pay

## 2023-08-25 ENCOUNTER — Emergency Department (HOSPITAL_BASED_OUTPATIENT_CLINIC_OR_DEPARTMENT_OTHER)
Admission: EM | Admit: 2023-08-25 | Discharge: 2023-08-25 | Disposition: A | Discharge status: Home / Self Care | Attending: Emergency Medicine | Admitting: Emergency Medicine

## 2023-08-25 ENCOUNTER — Emergency Department (HOSPITAL_BASED_OUTPATIENT_CLINIC_OR_DEPARTMENT_OTHER)

## 2023-08-25 DIAGNOSIS — R600 Localized edema: Secondary | ICD-10-CM | POA: Insufficient documentation

## 2023-08-25 LAB — CBC WITH DIFFERENTIAL/PLATELET
Abs Immature Granulocytes: 0.02 K/uL (ref 0.00–0.07)
Basophils Absolute: 0 K/uL (ref 0.0–0.1)
Basophils Relative: 0 %
Eosinophils Absolute: 0.2 K/uL (ref 0.0–0.5)
Eosinophils Relative: 2 %
HCT: 38.4 % (ref 36.0–46.0)
Hemoglobin: 12.1 g/dL (ref 12.0–15.0)
Immature Granulocytes: 0 %
Lymphocytes Relative: 57 %
Lymphs Abs: 4.1 K/uL — ABNORMAL HIGH (ref 0.7–4.0)
MCH: 24.7 pg — ABNORMAL LOW (ref 26.0–34.0)
MCHC: 31.5 g/dL (ref 30.0–36.0)
MCV: 78.5 fL — ABNORMAL LOW (ref 80.0–100.0)
Monocytes Absolute: 0.5 K/uL (ref 0.1–1.0)
Monocytes Relative: 7 %
Neutro Abs: 2.5 K/uL (ref 1.7–7.7)
Neutrophils Relative %: 34 %
Platelets: 203 K/uL (ref 150–400)
RBC: 4.89 MIL/uL (ref 3.87–5.11)
RDW: 14.4 % (ref 11.5–15.5)
WBC: 7.3 K/uL (ref 4.0–10.5)
nRBC: 0 % (ref 0.0–0.2)

## 2023-08-25 LAB — COMPREHENSIVE METABOLIC PANEL WITH GFR
ALT: 16 U/L (ref 0–44)
AST: 19 U/L (ref 15–41)
Albumin: 4.3 g/dL (ref 3.5–5.0)
Alkaline Phosphatase: 168 U/L — ABNORMAL HIGH (ref 38–126)
Anion gap: 12 (ref 5–15)
BUN: 9 mg/dL (ref 6–20)
CO2: 23 mmol/L (ref 22–32)
Calcium: 9.7 mg/dL (ref 8.9–10.3)
Chloride: 103 mmol/L (ref 98–111)
Creatinine, Ser: 0.96 mg/dL (ref 0.44–1.00)
GFR, Estimated: >60 mL/min (ref >60)
Glucose, Bld: 134 mg/dL — ABNORMAL HIGH (ref 70–99)
Potassium: 3.6 mmol/L (ref 3.5–5.1)
Sodium: 137 mmol/L (ref 135–145)
Total Bilirubin: 0.5 mg/dL (ref 0.0–1.2)
Total Protein: 7.7 g/dL (ref 6.5–8.1)

## 2023-08-25 LAB — PRO BRAIN NATRIURETIC PEPTIDE: Pro Brain Natriuretic Peptide: 52 pg/mL (ref <300.0)

## 2023-08-25 NOTE — ED Notes (Signed)
 Light green and lav sent to the lab

## 2023-08-25 NOTE — ED Triage Notes (Signed)
 Pt POV reporting increased bilateral  leg swelling and pain past few days. Denies SOB. Denies strenuous activity.

## 2023-08-25 NOTE — ED Notes (Signed)
 Got report from [place]. X-ray at bedside for portable chest. Labs sent by ER technician. Patient laying in bed, no distress, family at bedside.

## 2023-08-25 NOTE — ED Provider Notes (Signed)
 [place] EMERGENCY DEPARTMENT AT [facility] Provider Note   CSN: [id number] Arrival date & time: 08/25/23  0247     Patient presents with: Leg Swelling   Sherry Leonard is a 53 y.o. female.   53 yo F with a chief complaints of bilateral lower extremity edema.  This been going on for a few days now.  She has had swelling in her feet before but never extended up to her legs.  She denies any new medications or running out of home medications.  Denies any new occupation or hobby.        Prior to Admission medications   Medication Sig Start Date End Date Taking? Authorizing Provider  acetaminophen  (TYLENOL ) 500 MG tablet Take 1,000 mg by mouth every 6 (six) hours as needed for mild pain or fever.    [provider]  cyclobenzaprine  (FLEXERIL ) 10 MG tablet Take 1 tablet (10 mg total) by mouth 3 (three) times daily as needed for muscle spasms. 01/29/23   Caudle, Thersia Bitters, FNP  venlafaxine  XR (EFFEXOR -XR) 37.5 MG 24 hr capsule Take 1 capsule (37.5 mg total) by mouth daily with breakfast. 06/11/23   Caudle, Thersia Bitters, FNP    Allergies: Aspirin    Review of Systems  Updated Vital Signs BP 124/79   Pulse 89   Temp 98.3 F (36.8 C) (Oral)   Resp 19   Ht 5' 5.5 (1.664 m)   SpO2 100%   BMI 29.50 kg/m   Physical Exam Vitals and nursing note reviewed.  Constitutional:      General: She is not in acute distress.    Appearance: She is well-developed. She is not diaphoretic.  HENT:     Head: Normocephalic and atraumatic.  Eyes:     Pupils: Pupils are equal, round, and reactive to light.  Cardiovascular:     Rate and Rhythm: Normal rate and regular rhythm.     Heart sounds: No murmur heard.    No friction rub. No gallop.  Pulmonary:     Effort: Pulmonary effort is normal.     Breath sounds: No wheezing or rales.  Abdominal:     General: There is no distension.     Palpations: Abdomen is soft.     Tenderness: There is no abdominal tenderness.   Musculoskeletal:        General: Swelling present. No tenderness.     Cervical back: Normal range of motion and neck supple.     Comments: 1-2+ lower extremity edema up to the shins bilaterally.  Skin:    General: Skin is warm and dry.  Neurological:     Mental Status: She is alert and oriented to person, place, and time.  Psychiatric:        Behavior: Behavior normal.     (all labs ordered are listed, but only abnormal results are displayed) Labs Reviewed  CBC WITH DIFFERENTIAL/PLATELET - Abnormal; Notable for the following components:      Result Value   MCV 78.5 (*)    MCH 24.7 (*)    Lymphs Abs 4.1 (*)    All other components within normal limits  COMPREHENSIVE METABOLIC PANEL WITH GFR - Abnormal; Notable for the following components:   Glucose, Bld 134 (*)    Alkaline Phosphatase 168 (*)    All other components within normal limits  PRO BRAIN NATRIURETIC PEPTIDE    EKG: None  Radiology: [facility] Chest Port 1 View Result Date: 08/25/2023 CLINICAL DATA:  Peripheral edema EXAM: PORTABLE  CHEST 1 VIEW COMPARISON:  03/21/2023 FINDINGS: Cardiac shadow is stable. Lungs are well aerated bilaterally. Mild scarring is noted in lungs bilaterally stable from the prior exam. No focal confluent infiltrate is seen. IMPRESSION: No active disease. Electronically Signed   By: Oneil Devonshire M.D.   On: 08/25/2023 03:47     Procedures   Medications Ordered in the ED - No data to display                                  Medical Decision Making Amount and/or Complexity of Data Reviewed Labs: ordered. Radiology: ordered.   53 yo F with a chief complaints of leg swelling.  This been going on for a few days.  Most likely dependent edema.  Will obtain laboratory evaluation.  Chest x-ray.  Reassess.  Chest x-ray without edema or heart enlargement.  proBNP is normal.  Renal function at baseline.  No LFT elevation.  Discussed results with patient family.  Will discharge home.  PCP  follow-up.  3:50 AM:  I have discussed the diagnosis/risks/treatment options with the patient and family.  Evaluation and diagnostic testing in the emergency department does not suggest an emergent condition requiring admission or immediate intervention beyond what has been performed at this time.  They will follow up with PCP. We also discussed returning to the ED immediately if new or worsening sx occur. We discussed the sx which are most concerning (e.g., sudden worsening pain, fever, inability to tolerate by mouth) that necessitate immediate return. Medications administered to the patient during their visit and any new prescriptions provided to the patient are listed below.  Medications given during this visit Medications - No data to display   The patient appears reasonably screen and/or stabilized for discharge and I doubt any other medical condition or other [facility] requiring further screening, evaluation, or treatment in the ED at this time prior to discharge.       Final diagnoses:  Lower extremity edema    ED Discharge Orders     None          Emil Share, [place] 08/25/23 [id number]

## 2023-08-25 NOTE — Discharge Instructions (Signed)
 Try to exercise more.  Elevate your legs whenever you are not having moving around.  Compression stockings can help as well.  Please follow-up with your family doctor in the office.

## 2023-10-11 ENCOUNTER — Ambulatory Visit (HOSPITAL_BASED_OUTPATIENT_CLINIC_OR_DEPARTMENT_OTHER): Admitting: Family Medicine

## 2023-12-11 ENCOUNTER — Encounter (HOSPITAL_BASED_OUTPATIENT_CLINIC_OR_DEPARTMENT_OTHER): Payer: Self-pay

## 2023-12-11 ENCOUNTER — Ambulatory Visit (INDEPENDENT_AMBULATORY_CARE_PROVIDER_SITE_OTHER): Admitting: *Deleted

## 2023-12-11 VITALS — Ht 65.0 in | Wt 180.0 lb

## 2023-12-11 DIAGNOSIS — Z0000 Encounter for general adult medical examination without abnormal findings: Secondary | ICD-10-CM

## 2023-12-11 NOTE — Patient Instructions (Signed)
 Sherry Leonard , Thank you for taking time to come for your Medicare Wellness Visit. I appreciate your ongoing commitment to your health goals. Please review the following plan we discussed and let me know if I can assist you in the future.   Screening recommendations/referrals: Colonoscopy: Education provided Mammogram: Education provided Recommended yearly ophthalmology/optometry visit for glaucoma screening and checkup Recommended yearly dental visit for hygiene and checkup  Vaccinations: Influenza vaccine: Education provided Pneumococcal vaccine: Education provided Tdap vaccine: Education provided Shingles vaccine: Education provided      Preventive Care, Female Preventive care refers to lifestyle choices and visits with your health care provider that can promote health and wellness. What does preventive care include? A yearly physical exam. This is also called an annual well check. Dental exams once or twice a year. Routine eye exams. Ask your health care provider how often you should have your eyes checked. Personal lifestyle choices, including: Daily care of your teeth and gums. Regular physical activity. Eating a healthy diet. Avoiding tobacco and drug use. Limiting alcohol use. Practicing safe sex. Taking low-dose aspirin every day. Taking vitamin and mineral supplements as recommended by your health care provider. What happens during an annual well check? The services and screenings done by your health care provider during your annual well check will depend on your age, overall health, lifestyle risk factors, and family history of disease. Counseling  Your health care provider may ask you questions about your: Alcohol use. Tobacco use. Drug use. Emotional well-being. Home and relationship well-being. Sexual activity. Eating habits. History of falls. Memory and ability to understand (cognition). Work and work astronomer. Reproductive health. Screening  You may  have the following tests or measurements: Height, weight, and BMI. Blood pressure. Lipid and cholesterol levels. These may be checked every 5 years, or more frequently if you are over 35 years old. Skin check. Lung cancer screening. You may have this screening every year starting at age 42 if you have a 30-pack-year history of smoking and currently smoke or have quit within the past 15 years. Fecal occult blood test (FOBT) of the stool. You may have this test every year starting at age 72. Flexible sigmoidoscopy or colonoscopy. You may have a sigmoidoscopy every 5 years or a colonoscopy every 10 years starting at age 44. Hepatitis C blood test. Hepatitis B blood test. Sexually transmitted disease (STD) testing. Diabetes screening. This is done by checking your blood sugar (glucose) after you have not eaten for a while (fasting). You may have this done every 1-3 years. Bone density scan. This is done to screen for osteoporosis. You may have this done starting at age 21. Mammogram. This may be done every 1-2 years. Talk to your health care provider about how often you should have regular mammograms. Talk with your health care provider about your test results, treatment options, and if necessary, the need for more tests. Vaccines  Your health care provider may recommend certain vaccines, such as: Influenza vaccine. This is recommended every year. Tetanus, diphtheria, and acellular pertussis (Tdap, Td) vaccine. You may need a Td booster every 10 years. Zoster vaccine. You may need this after age 34. Pneumococcal 13-valent conjugate (PCV13) vaccine. One dose is recommended after age 69. Pneumococcal polysaccharide (PPSV23) vaccine. One dose is recommended after age 76. Talk to your health care provider about which screenings and vaccines you need and how often you need them. This information is not intended to replace advice given to you by your health care provider.  Make sure you discuss any  questions you have with your health care provider. Document Released: 02/26/2015 Document Revised: 10/20/2015 Document Reviewed: 12/01/2014 Elsevier Interactive Patient Education  2017 [employer].  Fall Prevention in the Home Falls can cause injuries. They can happen to people of all ages. There are many things you can do to make your home safe and to help prevent falls. What can I do on the outside of my home? Regularly fix the edges of walkways and driveways and fix any cracks. Remove anything that might make you trip as you walk through a door, such as a raised step or threshold. Trim any bushes or trees on the path to your home. Use bright outdoor lighting. Clear any walking paths of anything that might make someone trip, such as rocks or tools. Regularly check to see if handrails are loose or broken. Make sure that both sides of any steps have handrails. Any raised decks and porches should have guardrails on the edges. Have any leaves, snow, or ice cleared regularly. Use sand or salt on walking paths during winter. Clean up any spills in your garage right away. This includes oil or grease spills. What can I do in the bathroom? Use night lights. Install grab bars by the toilet and in the tub and shower. Do not use towel bars as grab bars. Use non-skid mats or decals in the tub or shower. If you need to sit down in the shower, use a plastic, non-slip stool. Keep the floor dry. Clean up any water that spills on the floor as soon as it happens. Remove soap buildup in the tub or shower regularly. Attach bath mats securely with double-sided non-slip rug tape. Do not have throw rugs and other things on the floor that can make you trip. What can I do in the bedroom? Use night lights. Make sure that you have a light by your bed that is easy to reach. Do not use any sheets or blankets that are too big for your bed. They should not hang down onto the floor. Have a firm chair that has side  arms. You can use this for support while you get dressed. Do not have throw rugs and other things on the floor that can make you trip. What can I do in the kitchen? Clean up any spills right away. Avoid walking on wet floors. Keep items that you use a lot in easy-to-reach places. If you need to reach something above you, use a strong step stool that has a grab bar. Keep electrical cords out of the way. Do not use floor polish or wax that makes floors slippery. If you must use wax, use non-skid floor wax. Do not have throw rugs and other things on the floor that can make you trip. What can I do with my stairs? Do not leave any items on the stairs. Make sure that there are handrails on both sides of the stairs and use them. Fix handrails that are broken or loose. Make sure that handrails are as long as the stairways. Check any carpeting to make sure that it is firmly attached to the stairs. Fix any carpet that is loose or worn. Avoid having throw rugs at the top or bottom of the stairs. If you do have throw rugs, attach them to the floor with carpet tape. Make sure that you have a light switch at the top of the stairs and the bottom of the stairs. If you do not have them,  ask someone to add them for you. What else can I do to help prevent falls? Wear shoes that: Do not have high heels. Have rubber bottoms. Are comfortable and fit you well. Are closed at the toe. Do not wear sandals. If you use a stepladder: Make sure that it is fully opened. Do not climb a closed stepladder. Make sure that both sides of the stepladder are locked into place. Ask someone to hold it for you, if possible. Clearly mark and make sure that you can see: Any grab bars or handrails. First and last steps. Where the edge of each step is. Use tools that help you move around (mobility aids) if they are needed. These include: Canes. Walkers. Scooters. Crutches. Turn on the lights when you go into a dark area.  Replace any light bulbs as soon as they burn out. Set up your furniture so you have a clear path. Avoid moving your furniture around. If any of your floors are uneven, fix them. If there are any pets around you, be aware of where they are. Review your medicines with your doctor. Some medicines can make you feel dizzy. This can increase your chance of falling. Ask your doctor what other things that you can do to help prevent falls. This information is not intended to replace advice given to you by your health care provider. Make sure you discuss any questions you have with your health care provider. Document Released: 11/26/2008 Document Revised: 07/08/2015 Document Reviewed: 03/06/2014 Elsevier Interactive Patient Education  2017 [employer].

## 2023-12-11 NOTE — Progress Notes (Signed)
 Subjective:   Sherry Leonard is a 53 y.o. female who presents for Medicare Annual (Subsequent) preventive examination.  Visit Complete: Virtual I connected with  Sherry Leonard on 12/11/23 by a audio enabled telemedicine application and verified that I am speaking with the correct person using two identifiers.  Patient Location: Home  Provider Location: Home Office  I discussed the limitations of evaluation and management by telemedicine. The patient expressed understanding and agreed to proceed.  Vital Signs: Because this visit was a virtual/telehealth visit, some criteria may be missing or patient reported. Any vitals not documented were not able to be obtained and vitals that have been documented are patient reported.   Cardiac Risk Factors include: advanced age (>76men, >64 women);obesity (BMI >30kg/m2);smoking/ tobacco exposure     Objective:    Today's Vitals   12/11/23 0853  Weight: 180 lb (81.6 kg)  Height: 5' 5 (1.651 m)  PainSc: 4    Body mass index is 29.95 kg/m.     12/11/2023    8:55 AM 08/25/2023    2:53 AM 03/24/2023    5:53 PM 01/26/2023    9:32 AM 09/28/2022    3:27 PM 02/22/2022    9:33 PM 03/06/2016   10:01 PM  Advanced Directives  Does Patient Have a Medical Advance Directive? No No No No No No No   Would patient like information on creating a medical advance directive?    No - Patient declined  No - Patient declined      Data saved with a previous flowsheet row definition    Current Medications (verified) Outpatient Encounter Medications as of 12/11/2023  Medication Sig   acetaminophen  (TYLENOL ) 500 MG tablet Take 1,000 mg by mouth every 6 (six) hours as needed for mild pain or fever.   cyclobenzaprine  (FLEXERIL ) 10 MG tablet Take 1 tablet (10 mg total) by mouth 3 (three) times daily as needed for muscle spasms.   venlafaxine  XR (EFFEXOR -XR) 37.5 MG 24 hr capsule Take 1 capsule (37.5 mg total) by mouth daily with breakfast.   No facility-administered  encounter medications on file as of 12/11/2023.    Allergies (verified) Aspirin   History: Past Medical History:  Diagnosis Date   Allergy    Anxiety    Arthritis    Depression    Hypokalemia 02/23/2022   Hyponatremia 02/23/2022   Narcolepsy    Ovarian cyst 01/02/2023   4cm ovarian cyst 01/02/23   Pyelonephritis 02/23/2022   Sepsis (HCC) 02/23/2022   Transaminitis 02/23/2022   Past Surgical History:  Procedure Laterality Date   TUBAL LIGATION     Family History  Problem Relation Age of Onset   Cancer Maternal Grandfather    Social History   Socioeconomic History   Marital status: Single    Spouse name: Not on file   Number of children: Not on file   Years of education: Not on file   Highest education level: Not on file  Occupational History   Not on file  Tobacco Use   Smoking status: Every Day    Current packs/day: 2.00    Average packs/day: 2.0 packs/day for 45.8 years (91.6 ttl pk-yrs)    Types: Cigarettes    Start date: 1980    Passive exposure: Current   Smokeless tobacco: Never  Vaping Use   Vaping status: Never Used  Substance and Sexual Activity   Alcohol use: Yes    Comment: rare   Drug use: No   Sexual activity: Yes  Birth control/protection: None  Other Topics Concern   Not on file  Social History Narrative   Not on file   Social Drivers of Health   Financial Resource Strain: Low Risk  (12/11/2023)   Overall Financial Resource Strain (CARDIA)    Difficulty of Paying Living Expenses: Not very hard  Food Insecurity: No Food Insecurity (12/11/2023)   Hunger Vital Sign    Worried About Running Out of Food in the Last Year: Never true    Ran Out of Food in the Last Year: Never true  Transportation Needs: No Transportation Needs (12/11/2023)   PRAPARE - Administrator, Civil Service (Medical): No    Lack of Transportation (Non-Medical): No  Physical Activity: Inactive (12/11/2023)   Exercise Vital Sign    Days of Exercise  per Week: 0 days    Minutes of Exercise per Session: 0 min  Stress: Stress Concern Present (12/11/2023)   [employer] of Occupational Health - Occupational Stress Questionnaire    Feeling of Stress: Rather much  Social Connections: Unknown (12/11/2023)   Social Connection and Isolation Panel    Frequency of Communication with Friends and Family: More than three times a week    Frequency of Social Gatherings with Friends and Family: More than three times a week    Attends Religious Services: Never    Database Administrator or Organizations: No    Attends Engineer, Structural: Never    Marital Status: Not on file    Tobacco Counseling Ready to quit: Not Answered Counseling given: Not Answered   Clinical Intake:  Pre-visit preparation completed: Yes  Pain : 0-10 Pain Score: 4  Pain Type: Acute pain Pain Location: Abdomen Pain Descriptors / Indicators: Aching, Dull, Nagging Pain Onset: In the past 7 days     Diabetes: No  How often do you need to have someone help you when you read instructions, pamphlets, or other written materials from your doctor or pharmacy?: 1 - Never  Interpreter Needed?: No  Information entered by :: Mliss Graff LPN   Activities of Daily Living    12/11/2023    8:58 AM 01/26/2023    9:23 AM  In your present state of health, do you have any difficulty performing the following activities:  Hearing? 0 0  Vision? 0 1  Difficulty concentrating or making decisions? 0 0  Walking or climbing stairs? 0 1  Comment  Due to her constant pain  Dressing or bathing? 0 0  Doing errands, shopping? 0 0  Preparing Food and eating ? N N  Using the Toilet? N N  In the past six months, have you accidently leaked urine? Y Y  Do you have problems with loss of bowel control? N N  Managing your Medications? N N  Managing your Finances? N N  Housekeeping or managing your Housekeeping? N N    Patient Care Team: Knute Thersia Bitters, FNP as PCP  - General (Family Medicine)  Indicate any recent Medical Services you may have received from other than [facility] providers in the past year (date may be approximate).     Assessment:   This is a routine wellness examination for Sherry Leonard.  Hearing/Vision screen Hearing Screening - Comments:: No trouble hearing Vision Screening - Comments:: Not up to date Unsure of name   Goals Addressed             This Visit's Progress    Patient Stated  Would like to get back pain under control       Depression Screen    12/11/2023    9:00 AM 06/11/2023    9:40 AM 01/26/2023    9:27 AM 01/25/2023   10:27 AM 01/09/2023   10:12 AM 04/23/2017    4:02 PM 04/12/2017    2:41 PM  PHQ 2/9 Scores  PHQ - 2 Score 4 0 0 0 0 5 5  PHQ- 9 Score 12 0 6 6  22 24     Fall Risk    12/11/2023    8:54 AM 06/11/2023    9:39 AM 01/29/2023    8:49 AM 01/26/2023    9:33 AM 01/25/2023   10:27 AM  Fall Risk   Falls in the past year? 0 0 0 0 0  Number falls in past yr: 0 0 0 0 0  Injury with Fall? 0 0 0 0 0  Risk for fall due to :  No Fall Risks No Fall Risks No Fall Risks No Fall Risks  Follow up Falls evaluation completed;Education provided;Falls prevention discussed Falls evaluation completed Falls evaluation completed Falls evaluation completed Falls evaluation completed    MEDICARE RISK AT HOME: Medicare Risk at Home Any stairs in or around the home?: No If so, are there any without handrails?: No Home free of loose throw rugs in walkways, pet beds, electrical cords, etc?: Yes Adequate lighting in your home to reduce risk of falls?: Yes Life alert?: No Use of a cane, walker or w/c?: No Grab bars in the bathroom?: No Shower chair or bench in shower?: No Elevated toilet seat or a handicapped toilet?: No  TIMED UP AND GO:  Was the test performed?  No    Cognitive Function:    01/26/2023    9:34 AM  MMSE - Mini Mental State Exam  Not completed: Unable to complete        12/11/2023     8:57 AM 01/26/2023    9:34 AM  6CIT Screen  What Year? 0 points 0 points  What month? 0 points 0 points  What time? 0 points 0 points  Count back from 20 0 points 0 points  Months in reverse 4 points 0 points  Repeat phrase 4 points 0 points  Total Score 8 points 0 points    Immunizations  There is no immunization history on file for this patient.  TDAP status: Due, Education has been provided regarding the importance of this vaccine. Advised may receive this vaccine at local pharmacy or Health Dept. Aware to provide a copy of the vaccination record if obtained from local pharmacy or Health Dept. Verbalized acceptance and understanding.  Flu Vaccine status: Due, Education has been provided regarding the importance of this vaccine. Advised may receive this vaccine at local pharmacy or Health Dept. Aware to provide a copy of the vaccination record if obtained from local pharmacy or Health Dept. Verbalized acceptance and understanding.  Pneumococcal vaccine status: Due, Education has been provided regarding the importance of this vaccine. Advised may receive this vaccine at local pharmacy or Health Dept. Aware to provide a copy of the vaccination record if obtained from local pharmacy or Health Dept. Verbalized acceptance and understanding.  Covid-19 vaccine status: Declined, Education has been provided regarding the importance of this vaccine but patient still declined. Advised may receive this vaccine at local pharmacy or Health Dept.or vaccine clinic. Aware to provide a copy of the vaccination record if obtained from local pharmacy or Health  Dept. Verbalized acceptance and understanding.  Qualifies for Shingles Vaccine? Yes   Zostavax completed No   Shingrix Completed?: No.    Education has been provided regarding the importance of this vaccine. Patient has been advised to call insurance company to determine out of pocket expense if they have not yet received this vaccine. Advised may also  receive vaccine at local pharmacy or Health Dept. Verbalized acceptance and understanding.  Screening Tests Health Maintenance  Topic Date Due   Pneumococcal Vaccine: 50+ Years (1 of 2 - PCV) Never done   Hepatitis B Vaccines 19-59 Average Risk (1 of 3 - 19+ 3-dose series) Never done   Cervical Cancer Screening (HPV/Pap Cotest)  Never done   Lung Cancer Screening  Never done   Zoster Vaccines- Shingrix (1 of 2) Never done   Influenza Vaccine  Never done   DTaP/Tdap/Td (1 - Tdap) 01/25/2024 (Originally 02/07/1990)   Mammogram  02/12/2024   Medicare Annual Wellness (AWV)  12/10/2024   Fecal DNA (Cologuard)  03/16/2025   Hepatitis C Screening  Completed   HIV Screening  Completed   HPV VACCINES  Aged Out   Meningococcal B Vaccine  Aged Out   COVID-19 Vaccine  Discontinued    Health Maintenance  Health Maintenance Due  Topic Date Due   Pneumococcal Vaccine: 50+ Years (1 of 2 - PCV) Never done   Hepatitis B Vaccines 19-59 Average Risk (1 of 3 - 19+ 3-dose series) Never done   Cervical Cancer Screening (HPV/Pap Cotest)  Never done   Lung Cancer Screening  Never done   Zoster Vaccines- Shingrix (1 of 2) Never done   Influenza Vaccine  Never done    Colonoscopy  Education provided  Mammogram  Education provided    Lung Cancer Screening: (Low Dose CT Chest recommended if Age 25-80 years, 20 pack-year currently smoking OR have quit w/in 15years.) does qualify.   Lung Cancer Screening Referral: 0rdered  Additional Screening:  Hepatitis C Screening: does not qualify; Completed 2024  Vision Screening: Recommended annual ophthalmology exams for early detection of glaucoma and other disorders of the eye. Is the patient up to date with their annual eye exam?  No  Who is the provider or what is the name of the office in which the patient attends annual eye exams? Education provided If pt is not established with a provider, would they like to be referred to a provider to establish  care? No .   Dental Screening: Recommended annual dental exams for proper oral hygiene    Community Resource Referral / Chronic Care Management: CRR required this visit?  No   CCM required this visit?  No     Plan:     I have personally reviewed and noted the following in the patient's chart:   Medical and social history Use of alcohol, tobacco or illicit drugs  Current medications and supplements including opioid prescriptions. Patient is not currently taking opioid prescriptions. Functional ability and status Nutritional status Physical activity Advanced directives List of other physicians Hospitalizations, surgeries, and ER visits in previous 12 months Vitals Screenings to include cognitive, depression, and falls Referrals and appointments  In addition, I have reviewed and discussed with patient certain preventive protocols, quality metrics, and best practice recommendations. A written personalized care plan for preventive services as well as general preventive health recommendations were provided to patient.     Mliss Graff, LPN   [id number]   After Visit Summary: (MyChart) Due to this being a telephonic  visit, the after visit summary with patients personalized plan was offered to patient via MyChart   Nurse Notes: Patient has an appointment on 10-29 at 8:10

## 2023-12-12 ENCOUNTER — Ambulatory Visit: Payer: Self-pay | Admitting: *Deleted

## 2023-12-12 ENCOUNTER — Ambulatory Visit (HOSPITAL_BASED_OUTPATIENT_CLINIC_OR_DEPARTMENT_OTHER): Admitting: Family Medicine

## 2023-12-12 NOTE — Telephone Encounter (Signed)
 FYI Only or Action Required?: Action required by provider: request for appointment.  Patient was last seen in primary care on 06/11/2023 by Knute Thersia Bitters, FNP.  Called Nurse Triage reporting Chest Pain.  Symptoms began today.  Interventions attempted: Nothing.  Symptoms are: stable.  Triage Disposition: See Physician Within 24 Hours  Patient/caregiver understands and will follow disposition?: Yes Patient reports she had chest pain going to get in car today- it lasted seconds and she does not have other symptoms. No open appointment- she can make due to child pick up from school. Patient advised be seen within 24 hours- UC is option. Office notified and they will reach out to reschedule appointment missed today.    Copied from CRM [id number]. Topic: Clinical - Red Word Triage >> Dec 12, 2023  7:57 AM Sherry Leonard wrote: Sherry Leonard that prompted transfer to Nurse Triage: Patient called to reschedule her appt for today unable to make it in time. States she is also having chest pains with 2 really sharp pains. Reason for Disposition  [1] Chest pain lasts < 5 minutes AND [2] NO chest pain or cardiac symptoms (e.g., breathing difficulty, sweating) now  (Exception: Chest pains that last only a few seconds.)  Answer Assessment - Initial Assessment Questions 1. LOCATION: Where does it hurt?       2 sharp pains this morning- left 2. RADIATION: Does the pain go anywhere else? (e.g., into neck, jaw, arms, back)     No- chest only 3. ONSET: When did the chest pain begin? (Minutes, hours or days)      today 4. PATTERN: Does the pain come and go, or has it been constant since it started?  Does it get worse with exertion?      Comes and goes- seconds, patient was going to get in her car 5. DURATION: How long does it last (e.g., seconds, minutes, hours)     Took her breath- seconds- then stopped 6. SEVERITY: How bad is the pain?  (e.g., Scale 1-10; mild, moderate, or severe)      Severe when occurring- not now 7. CARDIAC RISK FACTORS: Do you have any history of heart problems or risk factors for heart disease? (e.g., angina, prior heart attack; diabetes, high blood pressure, high cholesterol, smoker, or strong family history of heart disease)     smoke 8. PULMONARY RISK FACTORS: Do you have any history of lung disease?  (e.g., blood clots in lung, asthma, emphysema, birth control pills)     no 9. CAUSE: What do you think is causing the chest pain?     Unsure- patient thinks stress about the appointment 10. OTHER SYMPTOMS: Do you have any other symptoms? (e.g., dizziness, nausea, vomiting, sweating, fever, difficulty breathing, cough)       Nausea at times, sleepy- hard to wake  Protocols used: Chest Pain-A-AH

## 2025-01-06 ENCOUNTER — Ambulatory Visit (HOSPITAL_BASED_OUTPATIENT_CLINIC_OR_DEPARTMENT_OTHER)
